# Patient Record
Sex: Female | Born: 1975 | Race: White | Hispanic: No | Marital: Married | Smoking: Never smoker
Health system: Southern US, Community
[De-identification: ages and names within clinical notes are randomized; demographics above are authoritative.]

## PROBLEM LIST (undated history)

## (undated) DIAGNOSIS — F329 Major depressive disorder, single episode, unspecified: Secondary | ICD-10-CM

## (undated) DIAGNOSIS — I10 Essential (primary) hypertension: Secondary | ICD-10-CM

## (undated) DIAGNOSIS — F419 Anxiety disorder, unspecified: Secondary | ICD-10-CM

## (undated) DIAGNOSIS — F32A Depression, unspecified: Secondary | ICD-10-CM

## (undated) HISTORY — PX: WISDOM TOOTH EXTRACTION: SHX21

## (undated) HISTORY — PX: DIAGNOSTIC LAPAROSCOPY: SUR761

## (undated) SURGERY — Surgical Case
Anesthesia: *Unknown

---

## 2001-12-08 ENCOUNTER — Inpatient Hospital Stay (HOSPITAL_COMMUNITY): Admission: AD | Admit: 2001-12-08 | Discharge: 2001-12-11 | Payer: Self-pay | Admitting: Obstetrics and Gynecology

## 2001-12-08 ENCOUNTER — Encounter (INDEPENDENT_AMBULATORY_CARE_PROVIDER_SITE_OTHER): Payer: Self-pay | Admitting: *Deleted

## 2003-03-30 ENCOUNTER — Other Ambulatory Visit: Admission: RE | Admit: 2003-03-30 | Discharge: 2003-03-30 | Payer: Self-pay | Admitting: Obstetrics and Gynecology

## 2004-06-05 ENCOUNTER — Other Ambulatory Visit: Admission: RE | Admit: 2004-06-05 | Discharge: 2004-06-05 | Payer: Self-pay | Admitting: Obstetrics and Gynecology

## 2005-06-09 ENCOUNTER — Other Ambulatory Visit: Admission: RE | Admit: 2005-06-09 | Discharge: 2005-06-09 | Payer: Self-pay | Admitting: Obstetrics and Gynecology

## 2005-06-24 ENCOUNTER — Ambulatory Visit (HOSPITAL_COMMUNITY): Admission: RE | Admit: 2005-06-24 | Discharge: 2005-06-24 | Payer: Self-pay | Admitting: Obstetrics and Gynecology

## 2005-06-24 ENCOUNTER — Encounter (INDEPENDENT_AMBULATORY_CARE_PROVIDER_SITE_OTHER): Payer: Self-pay | Admitting: *Deleted

## 2006-12-17 ENCOUNTER — Ambulatory Visit (HOSPITAL_COMMUNITY): Admission: RE | Admit: 2006-12-17 | Discharge: 2006-12-17 | Payer: Self-pay | Admitting: Obstetrics and Gynecology

## 2007-07-22 ENCOUNTER — Inpatient Hospital Stay (HOSPITAL_COMMUNITY): Admission: AD | Admit: 2007-07-22 | Discharge: 2007-07-25 | Payer: Self-pay | Admitting: Obstetrics & Gynecology

## 2007-07-23 ENCOUNTER — Encounter (INDEPENDENT_AMBULATORY_CARE_PROVIDER_SITE_OTHER): Payer: Self-pay | Admitting: Obstetrics and Gynecology

## 2007-07-30 ENCOUNTER — Ambulatory Visit: Payer: Self-pay | Admitting: Pulmonary Disease

## 2007-07-30 ENCOUNTER — Encounter: Payer: Self-pay | Admitting: Emergency Medicine

## 2007-07-30 ENCOUNTER — Inpatient Hospital Stay (HOSPITAL_COMMUNITY): Admission: AD | Admit: 2007-07-30 | Discharge: 2007-08-04 | Payer: Self-pay | Admitting: Obstetrics & Gynecology

## 2009-07-27 ENCOUNTER — Ambulatory Visit (HOSPITAL_COMMUNITY): Admission: RE | Admit: 2009-07-27 | Discharge: 2009-07-27 | Payer: Self-pay | Admitting: Obstetrics and Gynecology

## 2009-09-10 ENCOUNTER — Ambulatory Visit (HOSPITAL_COMMUNITY): Admission: RE | Admit: 2009-09-10 | Discharge: 2009-09-10 | Payer: Self-pay | Admitting: Obstetrics and Gynecology

## 2009-10-12 ENCOUNTER — Ambulatory Visit (HOSPITAL_COMMUNITY): Admission: RE | Admit: 2009-10-12 | Discharge: 2009-10-12 | Payer: Self-pay | Admitting: Obstetrics and Gynecology

## 2009-11-26 ENCOUNTER — Inpatient Hospital Stay (HOSPITAL_COMMUNITY): Admission: AD | Admit: 2009-11-26 | Discharge: 2009-11-26 | Payer: Self-pay | Admitting: Obstetrics & Gynecology

## 2009-11-29 ENCOUNTER — Other Ambulatory Visit: Payer: Self-pay | Admitting: Obstetrics and Gynecology

## 2009-11-29 ENCOUNTER — Inpatient Hospital Stay (HOSPITAL_COMMUNITY): Admission: AD | Admit: 2009-11-29 | Discharge: 2009-12-02 | Payer: Self-pay | Admitting: Obstetrics and Gynecology

## 2010-10-20 ENCOUNTER — Encounter: Payer: Self-pay | Admitting: Obstetrics and Gynecology

## 2010-12-22 LAB — CBC
HCT: 32.8 % — ABNORMAL LOW (ref 36.0–46.0)
HCT: 33.8 % — ABNORMAL LOW (ref 36.0–46.0)
HCT: 34.4 % — ABNORMAL LOW (ref 36.0–46.0)
Hemoglobin: 11.2 g/dL — ABNORMAL LOW (ref 12.0–15.0)
Hemoglobin: 11.7 g/dL — ABNORMAL LOW (ref 12.0–15.0)
Hemoglobin: 9.9 g/dL — ABNORMAL LOW (ref 12.0–15.0)
MCHC: 34.1 g/dL (ref 30.0–36.0)
MCHC: 34.2 g/dL (ref 30.0–36.0)
MCV: 89.6 fL (ref 78.0–100.0)
Platelets: 125 10*3/uL — ABNORMAL LOW (ref 150–400)
Platelets: 126 10*3/uL — ABNORMAL LOW (ref 150–400)
RBC: 3.66 MIL/uL — ABNORMAL LOW (ref 3.87–5.11)
RBC: 3.77 MIL/uL — ABNORMAL LOW (ref 3.87–5.11)
RDW: 11.8 % (ref 11.5–15.5)
WBC: 12 10*3/uL — ABNORMAL HIGH (ref 4.0–10.5)

## 2010-12-22 LAB — COMPREHENSIVE METABOLIC PANEL
ALT: 16 U/L (ref 0–35)
Albumin: 2.7 g/dL — ABNORMAL LOW (ref 3.5–5.2)
BUN: 5 mg/dL — ABNORMAL LOW (ref 6–23)
Chloride: 105 mEq/L (ref 96–112)
Creatinine, Ser: 0.59 mg/dL (ref 0.4–1.2)
GFR calc non Af Amer: >60 mL/min (ref >60)
Potassium: 3.2 mEq/L — ABNORMAL LOW (ref 3.5–5.1)
Total Bilirubin: 0.2 mg/dL — ABNORMAL LOW (ref 0.3–1.2)

## 2010-12-22 LAB — URINALYSIS, ROUTINE W REFLEX MICROSCOPIC
Bilirubin Urine: NEGATIVE
Nitrite: NEGATIVE
Protein, ur: NEGATIVE mg/dL
Specific Gravity, Urine: 1.01 (ref 1.005–1.030)

## 2011-02-14 NOTE — Op Note (Signed)
NAME:  Ellen Mendoza, Ellen Mendoza                 ACCOUNT NO.:  [id number]   MEDICAL RECORD NO.:  [id number]          PATIENT TYPE:  AMB   LOCATION:  SDC                           FACILITY:  [facility]   PHYSICIAN:  Randye Lobo, M.D.   DATE OF BIRTH:  12/30/1975   DATE OF PROCEDURE:  06/24/2005  DATE OF DISCHARGE:                                 OPERATIVE REPORT   PREOPERATIVE DIAGNOSIS:  1.  Chronic pelvic pain.  2.  Dysmenorrhea.  3.  Dyspareunia.   POSTOPERATIVE DIAGNOSIS:  1.  Chronic pelvic pain.  2.  Dysmenorrhea.  3.  Dyspareunia.  4.  Uterine left cornual perforation.   PROCEDURE:  Diagnostic laparoscopy, excision of left paratubal cyst, cautery  to uterine fundus.   SURGEON:  Randye Lobo, M.D.   ASSISTANT:  Carrington Clamp, M.D.   ANESTHESIA:  General endotracheal, local with Marcaine.   IV FLUIDS:  1000 mL  Ringer's lactate.   ESTIMATED BLOOD LOSS:  Minimal.   URINE OUTPUT:  50 mL   COMPLICATIONS:  Is uterine fundal perforation to the left cornual region  with a uterine sound.   INDICATIONS FOR SURGERY:  The patient is a 35 year old gravida 1, para 1  Caucasian female with a last menstrual period May 30, 2005 who  presented to the office with chronic left lower quadrant pain, dysmenorrhea,  dyspareunia. The patient reported that oral contraceptives did not  ameliorate her symptoms. She had an ultrasound performed May 29, 2005  which documented a normal uterus and ovaries. The patient previously had  negative gonorrhea and Chlamydia cultures. The patient wished for surgical  evaluation and treatment of her pelvic pain.   FINDINGS:  Exam under anesthesia revealed a small, anteverted, mobile  uterus. No adnexal masses were appreciated.   Laparoscopy demonstrated perforation of the uterine sound to the uterine  fundus in the region of a left cornua of the patient. This was noted to  bleed slightly after the uterine sound was withdrawn back further into the  uterine cavity. The uterus itself was otherwise unremarkable. There was a 1  cm left paratubal cyst. There was a 2 cm cyst of the right ovary which  appeared to be simple. The fallopian tubes were unremarkable. The patient  had no evidence of adhesions nor endometriosis in the abdomen or pelvis. The  appendix was normal as was the liver.   SPECIMENS:  Left paratubal cyst was sent to pathology.   PROCEDURE:  The patient was reidentified in the preoperative hold area. She  was brought down to the operating room where general endotracheal anesthesia  was induced. The patient was then placed in the dorsal lithotomy position  and the abdomen and vagina were sterilely prepped and draped. A Foley  catheter was placed inside the bladder. The speculum was placed inside the  vagina and a single-tooth tenaculum was a placed on the anterior cervical  lip. An attempt was made to pass the Hulka tenaculum, however the cervix  appeared to have a bit of stenosis. The vaginal instruments were removed and  bimanual examination was performed  which documented an anteverted uterus.  The speculum was then replaced inside the vagina and the single-tooth  tenaculum was replaced on the anterior cervical lip. A uterine sound was  placed through the cervical os without difficulty. This was withdrawn and  again an attempt was made to place a Hulka tenaculum which would not pass  easily through the cervix. The decision was therefore made to place the  uterine sound back inside the uterine cavity and attach this to the single-  tooth tenaculum using Steri-Strips for uterine manipulation as I had done  for many other patients with similar circumstances.   Attention was then turned to the abdomen where the laparoscopy was  performed. An Allis clamp was placed below the umbilicus elevating it and a  1 cm umbilical incision was then created with the scalpel. An Allis clamp  was used to dissect down to the level of the  fascia. A 10 mm trocar was then  inserted directly into the peritoneal cavity. Laparoscope confirmed proper  placement of the instruments and 11 scope confirmed proper placement within  the trocar and a pneumoperitoneum was then achieved with CO2 gas. The  patient was then placed in the Trendelenburg position and the laparoscope  was used to perform an initial inspection of the abdominal and pelvic  organs.   A 5 mm suprapubic incision was then created with the scalpel and a 5 mm  trocar was placed under direct visualization of the laparoscope. Further  inspection of the abdominal and pelvic organs was performed and the findings  were as noted above. The perforation was noted. An additional 5 mm incision  was created, this time in the right lower quadrant. A 5 mm trocar was placed  under direct visualization of the laparoscope. The left paratubal cyst was  then grasped at this point and bipolar cautery was used to cauterize the  stalk of the left paratubal cyst. It was then sharply excised and the cyst  was removed and sent to pathology. The uterine sound was withdrawn from the  uterine fundal position at this point and there was some slight oozing  appreciated of the uterine serosa. The left fallopian tube was retracted out  of the way so that the bipolar cautery forceps could be used to  superficially cauterize the bleeding area which was noted to incorporate  approximately 4-5 mm of length of the uterine fundal serosa. This was  successful. The area was then irrigated and there was no further bleeding  appreciated. The pneumoperitoneum was significantly released and again there  was no additional bleeding. The procedure was therefore terminated.   The laparoscopic instruments were removed under visualization of the  laparoscope. The umbilical trocar was removed. The skin incisions were closed with subcuticular sutures of 3-0 plain. Steri-Strips and Band-Aids  were placed over the  incisions.   An inspection of the cervix was performed by placing a speculum in the  vagina and there was no evidence of a laceration of the cervical lip.  Hemostasis was good.   The patient's Foley catheter was removed. She was cleansed of any remaining  Betadine, awakened, and escorted to the recovery room in stable and awake  condition.      Randye Lobo, M.D.  Electronically Signed     BES/MEDQ  D:  06/24/2005  T:  06/24/2005  Job:  [id number]

## 2011-02-14 NOTE — Discharge Summary (Signed)
NAMEFRANCESS, Ellen Mendoza                 ACCOUNT NO.:  [id number]   MEDICAL RECORD NO.:  [id number]          PATIENT TYPE:  INP   LOCATION:  9304                          FACILITY:  [facility]   PHYSICIAN:  Malva Limes, M.D.    DATE OF BIRTH:  [birth year]   DATE OF ADMISSION:  07/20/2007  DATE OF DISCHARGE:  08/04/2007                               DISCHARGE SUMMARY   FINAL DIAGNOSIS:  1. Postpartum patient with delivery on October 24.  2. Postpartum headaches.  3. Hypertension.  4. Postpartum preeclampsia.   COMPLICATIONS:  None.   This 35 year old, G2, P2-0-0-2 had just previously had a delivery on  July 23, 2007.  She presents at this time with a headache, shortness  of breath. The presents at the hospital.  She is seen by pulmonology.  Blood pressure on presentation was normotensive. She was taken to the  [facility] ED and treated with mag sulfate 2 mg x2. Now the patient's  blood pressure is about 113/109 and pulmonary was asked to evaluate the  patient. She has continued on her mag sulfate. A  2-D echo was ordered  to rule out postpartum cardiac cardiomegaly, a drug screen to rule out  any type of amphetamines. The patient did have a negative chest x-ray at  this time. The patient must have started out at [facility]. The patient  is now at [facility] in the ICU. She is still having headaches.  She is normotensive. The patient continues to have a headache. Percocet  was given to the patient, it did help her headache. Mag sulfate was  stopped on hospital day #4. The patient was started on oral labetalol  200 mg b.i.d. Her lab work has improved. She was able to be transferred  to the regular mother baby unit. By hospital day #6, the patient was  felt ready for discharge, she was stable.  She was rarely having any  headaches.  She was to continue her labetalol 200 mg twice daily and her  Percocet was given for pain 1-2 every 4-6 hours as needed for pain.  She  has a follow-up  appointment on Monday the 10th. The patient's to call  with any elevated blood pressures or if her headache intensifies or any  visual changes.   LABS ON DISCHARGE:  The patient has a hemoglobin of 13.1, white blood  cell count of 10.1, platelets of 292,000.  She she an AST of 18 with an  ALT of 49. Her LDH and uric acid are both within normal limits upon  discharge.      Leilani Able, P.A.-C.    ______________________________  Malva Limes, M.D.    MB/MEDQ  D:  08/25/2007  T:  08/25/2007  Job:  [id number]

## 2011-07-08 LAB — CBC
HCT: 38.5
Hemoglobin: 13.1
Hemoglobin: 13.7
Hemoglobin: 13.7
MCHC: 34.6
MCV: 92.3
MCV: 92.6
Platelets: 283
Platelets: 305
RBC: 4.15
RBC: 4.27
RBC: 4.3
RDW: 12
RDW: 12.2
WBC: 10
WBC: 10.1

## 2011-07-08 LAB — CARDIAC PANEL(CRET KIN+CKTOT+MB+TROPI)
CK, MB: 2.2
Total CK: 128

## 2011-07-08 LAB — COMPREHENSIVE METABOLIC PANEL
ALT: 49 — ABNORMAL HIGH
AST: 55 — ABNORMAL HIGH
Albumin: 2.6 — ABNORMAL LOW
Alkaline Phosphatase: 102
Alkaline Phosphatase: 89
BUN: 10
CO2: 28
Calcium: 7.3 — ABNORMAL LOW
Chloride: 104
Creatinine, Ser: 0.75
GFR calc Af Amer: >60
GFR calc non Af Amer: >60
GFR calc non Af Amer: >60
GFR calc non Af Amer: >60
Glucose, Bld: 110 — ABNORMAL HIGH
Glucose, Bld: 96
Potassium: 3.1 — ABNORMAL LOW
Potassium: 3.6
Sodium: 138
Sodium: 143
Total Bilirubin: 0.5
Total Bilirubin: 0.6
Total Protein: 5.3 — ABNORMAL LOW
Total Protein: 5.6 — ABNORMAL LOW
Total Protein: 6.1

## 2011-07-08 LAB — DIFFERENTIAL
Basophils Relative: 0
Lymphocytes Relative: 22
Lymphs Abs: 2.2
Monocytes Relative: 7
Neutrophils Relative %: 70

## 2011-07-08 LAB — LACTATE DEHYDROGENASE
LDH: 245
LDH: 276 — ABNORMAL HIGH

## 2011-07-08 LAB — MAGNESIUM: Magnesium: 4.9 — ABNORMAL HIGH

## 2011-07-09 LAB — URINALYSIS, ROUTINE W REFLEX MICROSCOPIC
Bilirubin Urine: NEGATIVE
Nitrite: NEGATIVE
Specific Gravity, Urine: 1.015
Specific Gravity, Urine: 1.011
Urobilinogen, UA: 0.2
Urobilinogen, UA: 0.2
pH: 7
pH: 7

## 2011-07-09 LAB — I-STAT 8, (EC8 V) (CONVERTED LAB)
BUN: 17
Bicarbonate: 23.6
Glucose, Bld: 84
Hemoglobin: 12.6
Sodium: 138
pCO2, Ven: 31.7 — ABNORMAL LOW
pH, Ven: 7.48 — ABNORMAL HIGH

## 2011-07-09 LAB — DIFFERENTIAL
Lymphocytes Relative: 27
Lymphs Abs: 2.8
Monocytes Absolute: 0.6
Monocytes Relative: 5
Neutro Abs: 6.8
Neutrophils Relative %: 65

## 2011-07-09 LAB — URINE MICROSCOPIC-ADD ON

## 2011-07-09 LAB — CBC
HCT: 31.4 — ABNORMAL LOW
HCT: 35.4 — ABNORMAL LOW
Hemoglobin: 12.4
MCHC: 35.6
MCHC: 35.1
MCV: 89.9
Platelets: 143 — ABNORMAL LOW
Platelets: 157
Platelets: 249
RDW: 12.2
RDW: 12.3
RDW: 11.9
WBC: 13.3 — ABNORMAL HIGH

## 2011-07-09 LAB — COMPREHENSIVE METABOLIC PANEL
Albumin: 3 — ABNORMAL LOW
BUN: 16
Calcium: 8.5
Glucose, Bld: 83
Sodium: 138
Total Protein: 5.9 — ABNORMAL LOW

## 2011-07-09 LAB — RPR: RPR Ser Ql: NONREACTIVE

## 2011-07-09 LAB — POCT CARDIAC MARKERS
CKMB, poc: 4.2
Myoglobin, poc: 74.3
Myoglobin, poc: 79.9
Operator id: 270111
Troponin i, poc: <0.05

## 2011-07-09 LAB — B-NATRIURETIC PEPTIDE (CONVERTED LAB): Pro B Natriuretic peptide (BNP): 505 — ABNORMAL HIGH

## 2011-07-29 IMAGING — US US OB DETAIL+14 WK
1 series · 14 of 28 positions shown · non-contrast
Comparison: none

OBSTETRICAL ULTRASOUND:
 This ultrasound was performed in The [HOSPITAL], and the [facility] report will be stored to [REDACTED] PACS.  This report is also available in [HOSPITAL]?s accessANYware.

[Series 1: us ob detail+14 wk · 108 acquisitions, 14 frames shown]
[im 4/108]
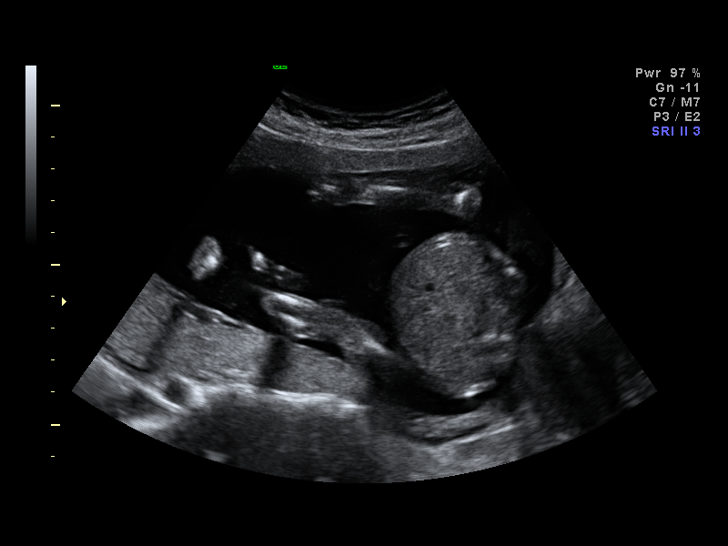
[im 12/108]
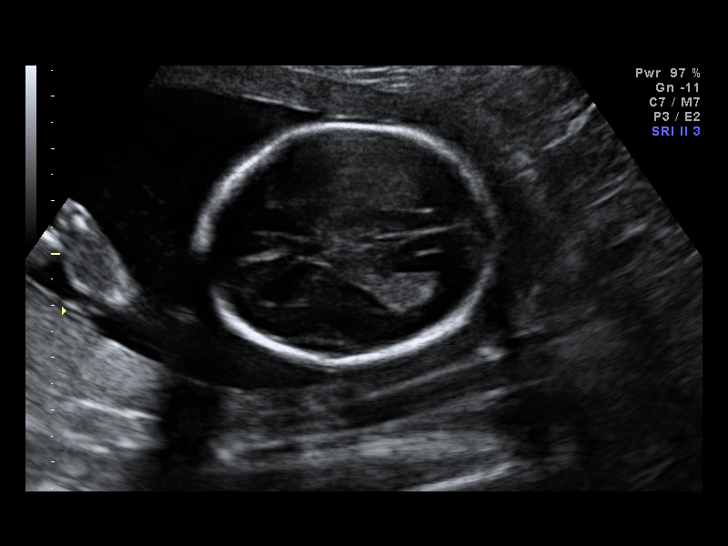
[im 20/108]
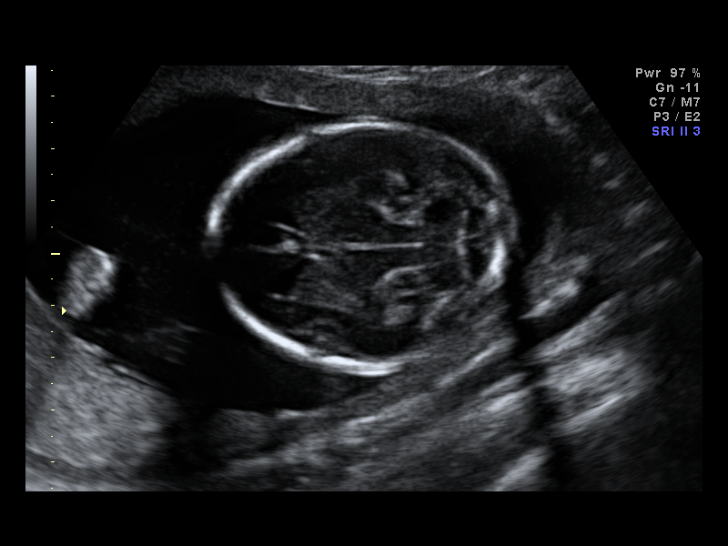
[im 28/108]
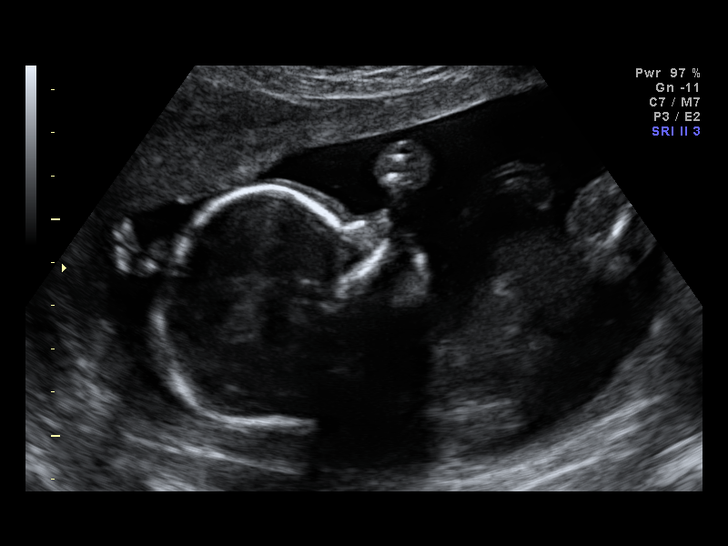
[im 36/108]
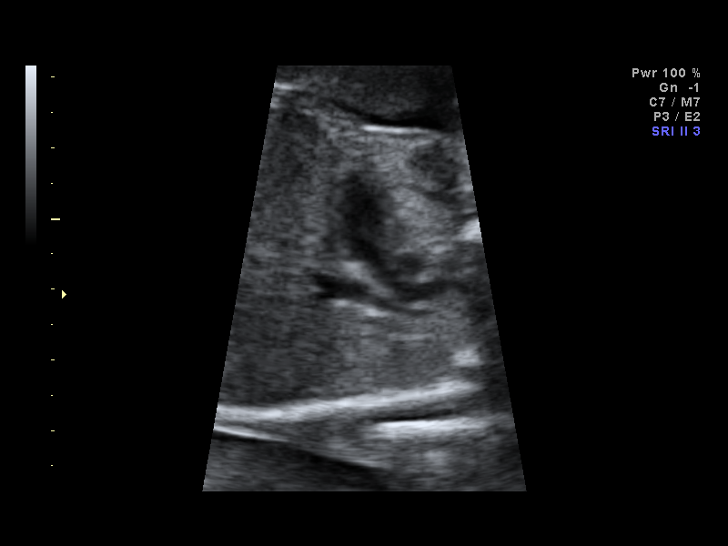
[im 44/108]
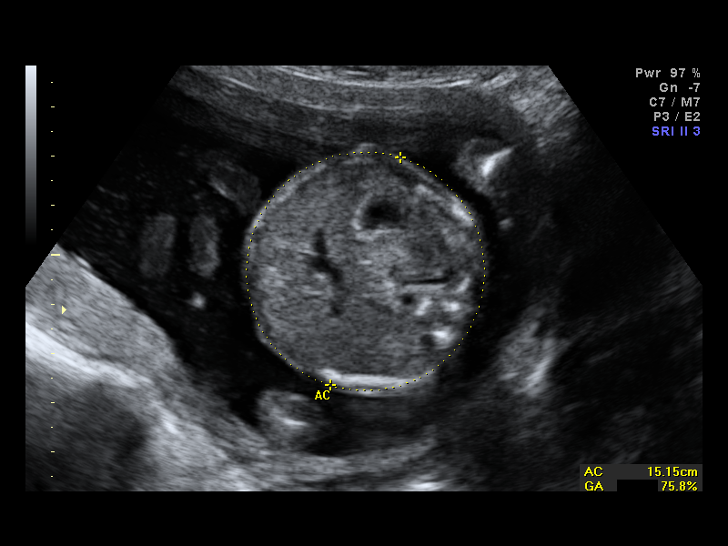
[im 52/108]
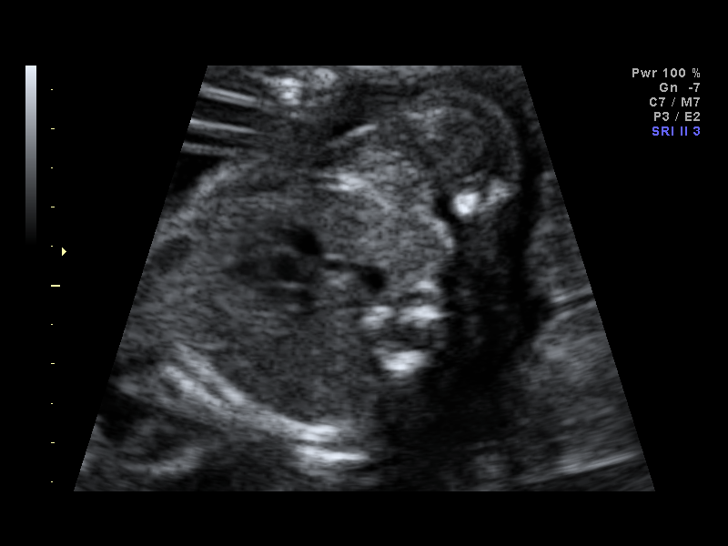
[im 60/108]
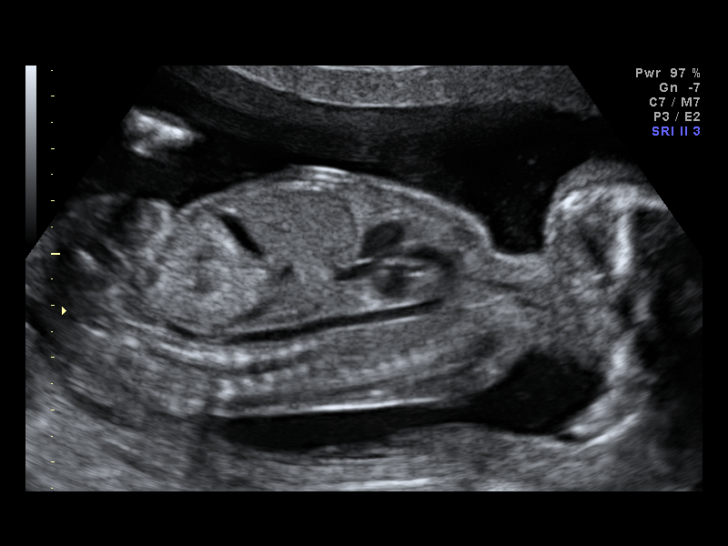
[im 68/108]
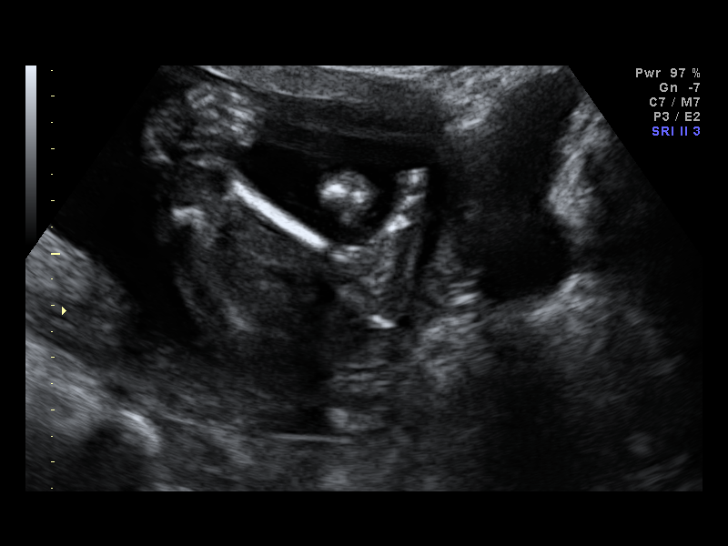
[im 76/108]
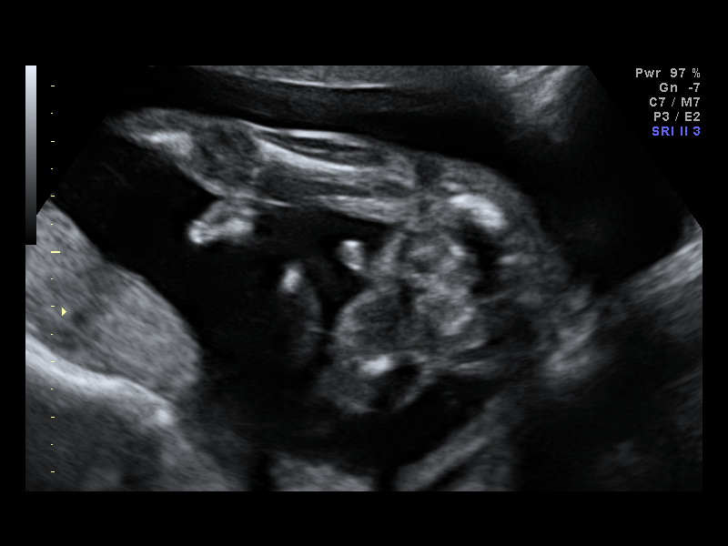
[im 84/108]
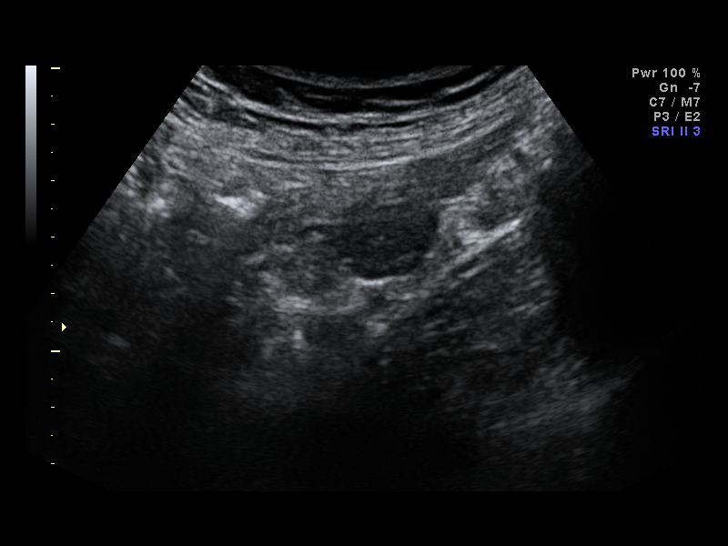
[im 92/108]
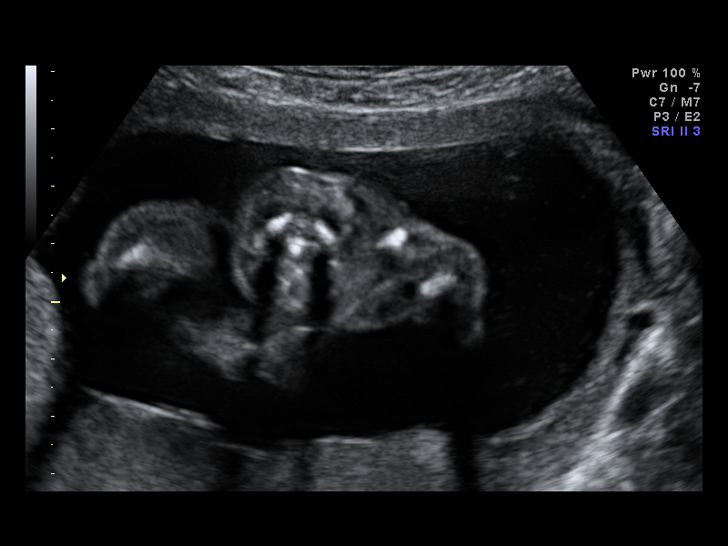
[im 100/108]
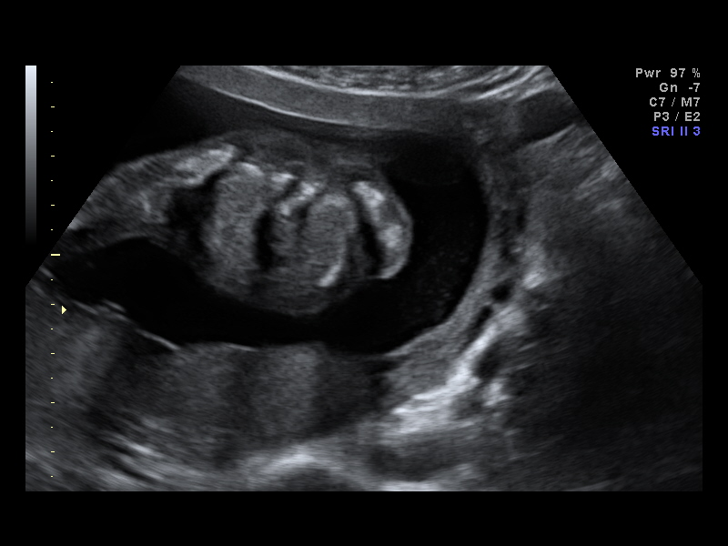
[im 108/108]
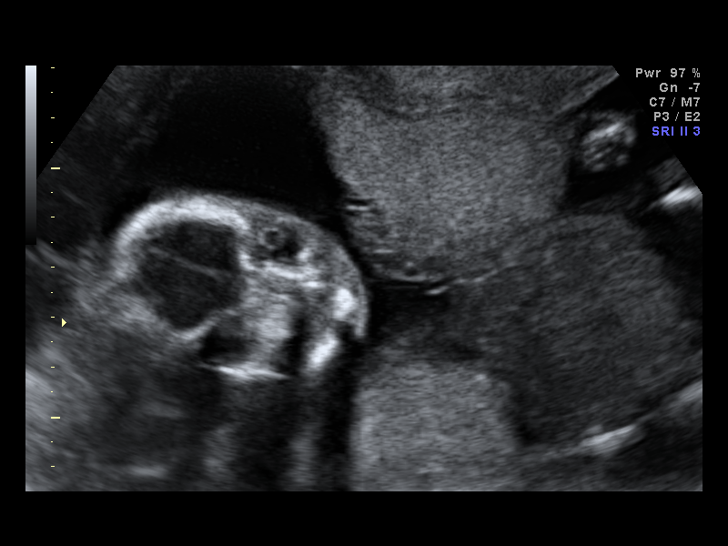

[14 of 28 positions shown; findings below may reference images not displayed]

IMPRESSION: [facility] has also been faxed to the ordering physician.

## 2011-09-12 IMAGING — US US OB FOLLOW-UP
1 series · 18 of 28 positions shown · non-contrast
Comparison: none

OBSTETRICAL ULTRASOUND:
 This ultrasound was performed in The [HOSPITAL], and the [facility]/GY[facility] report will be stored to [REDACTED] PACS.  This report is also available in [HOSPITAL]?s accessANYware.

[Series 1: us ob follow-up · 53 acquisitions, 18 frames shown]
[im 1/53]
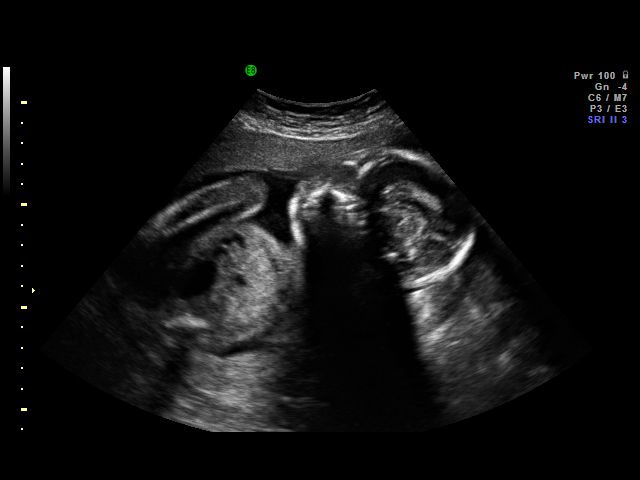
[im 4/53]
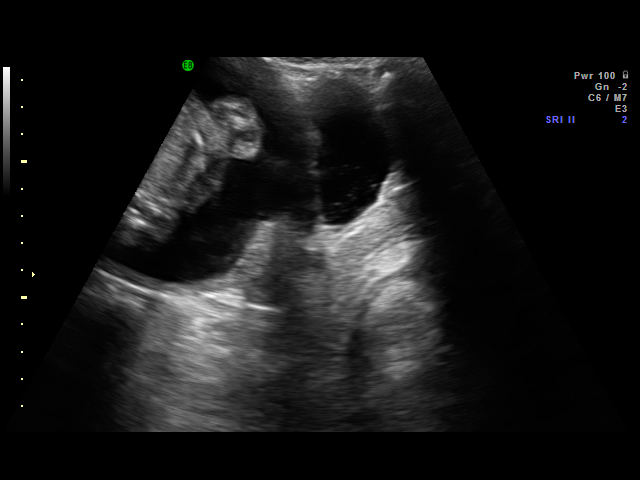
[im 6/53]
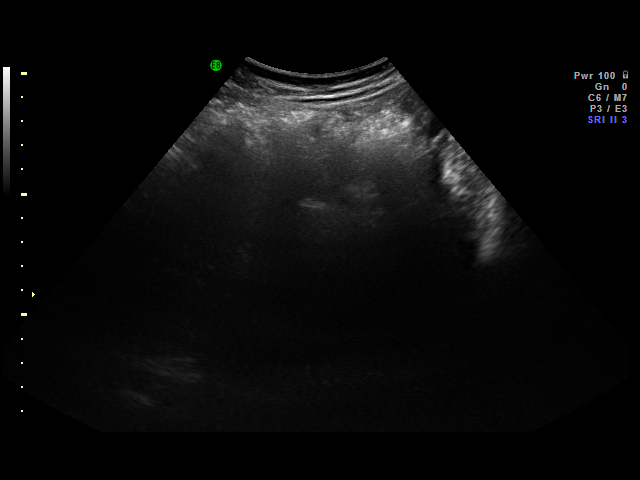
[im 10/53]
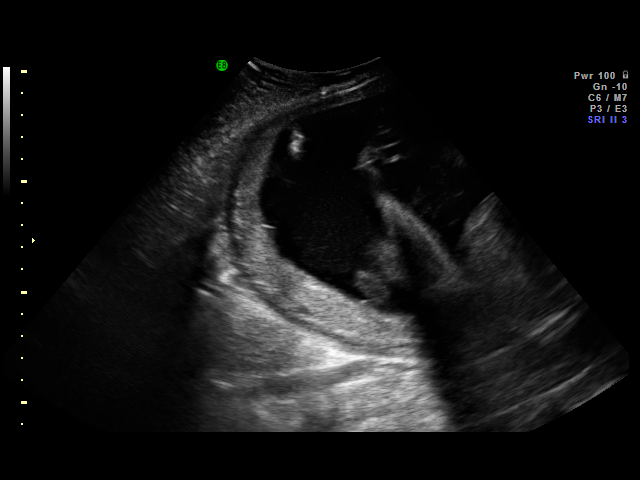
[im 14/53]
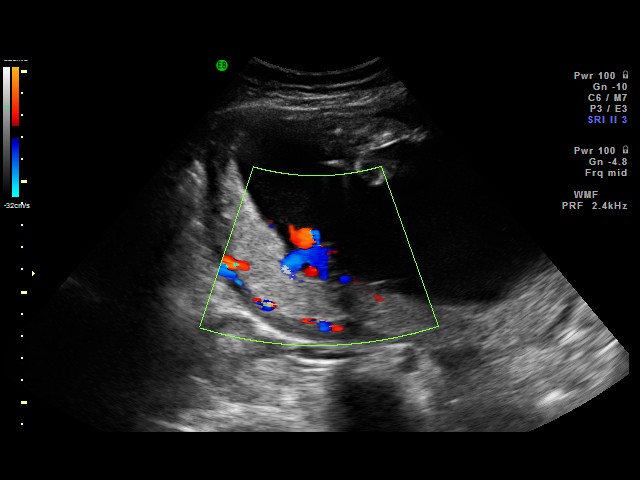
[im 16/53]
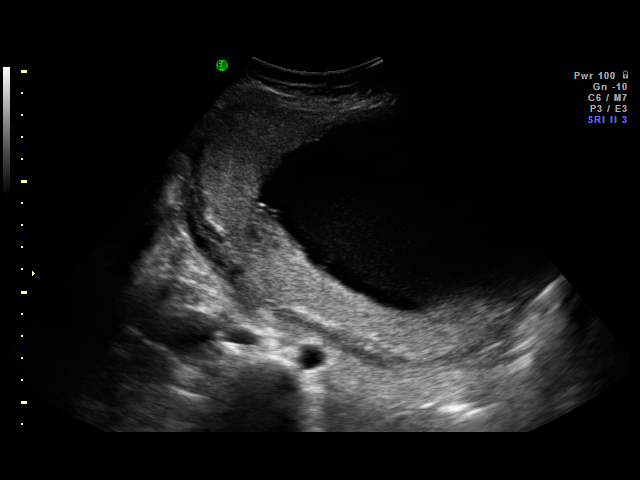
[im 20/53]
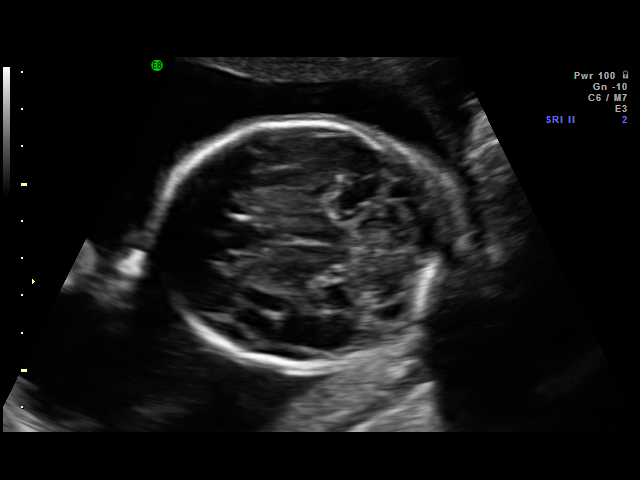
[im 22/53]
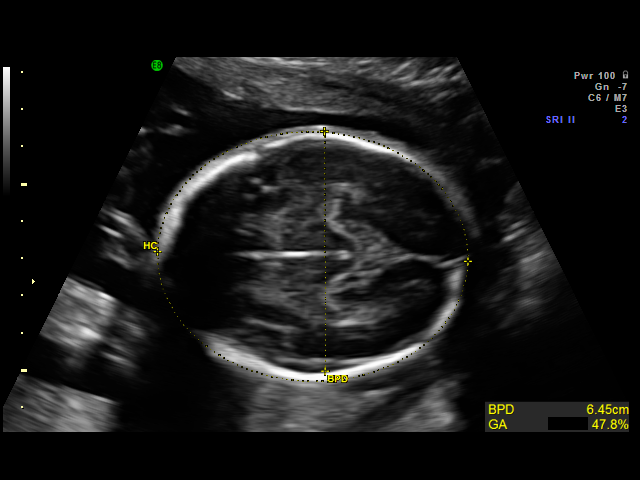
[im 26/53]
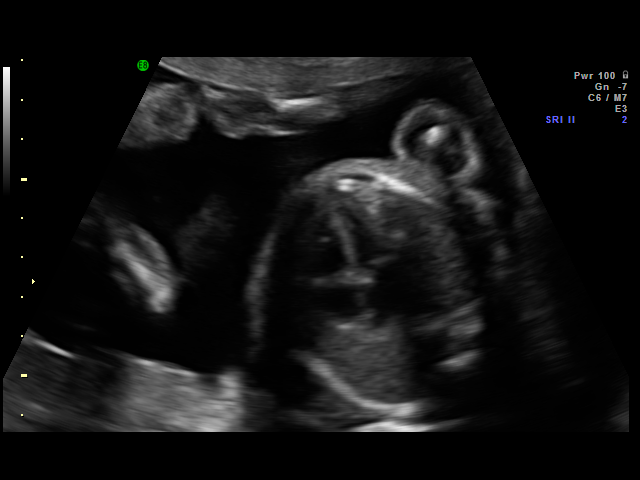
[im 27/53]
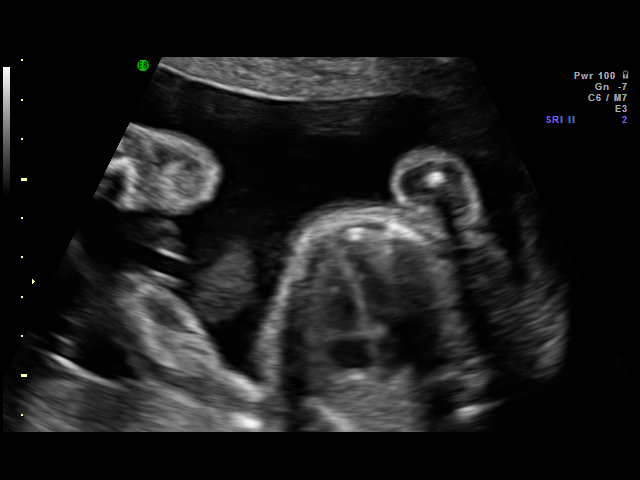
[im 31/53]
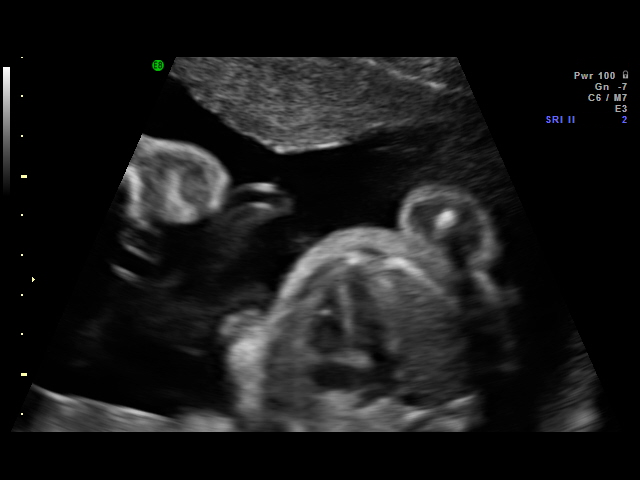
[im 33/53]
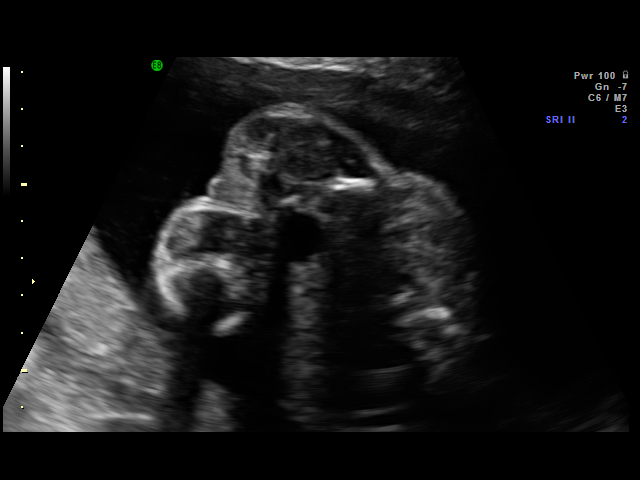
[im 37/53]
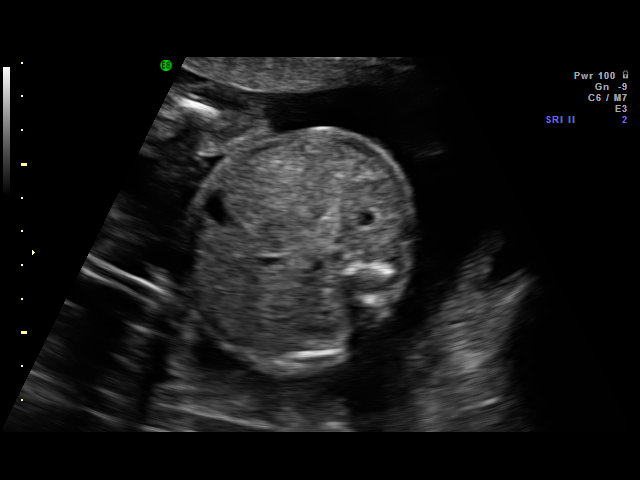
[im 41/53]
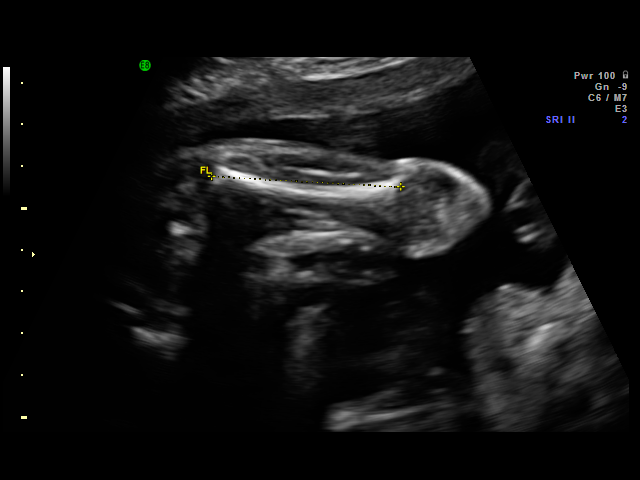
[im 43/53]
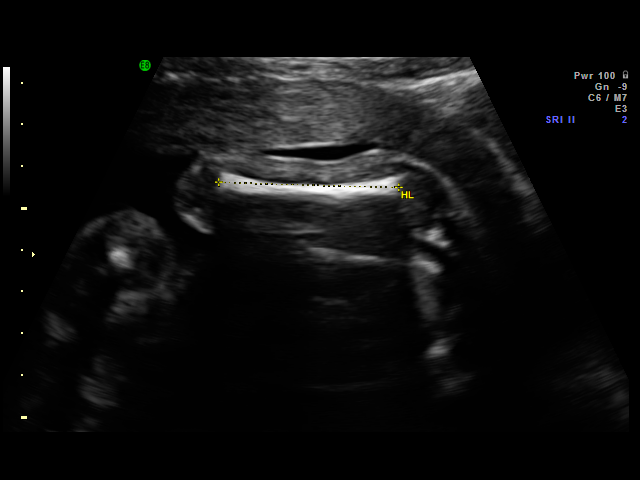
[im 47/53]
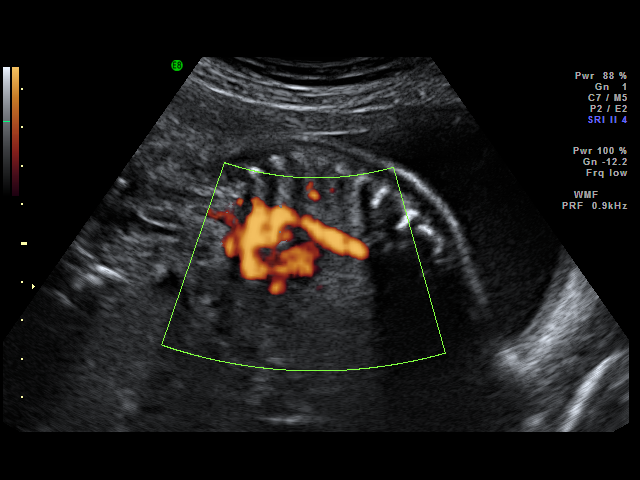
[im 49/53]
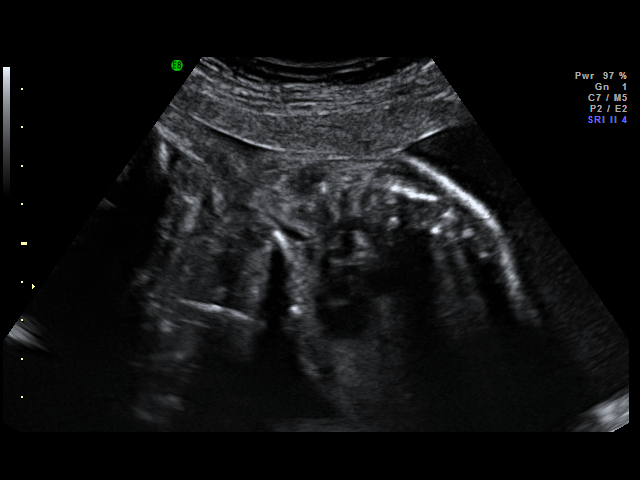
[im 53/53]
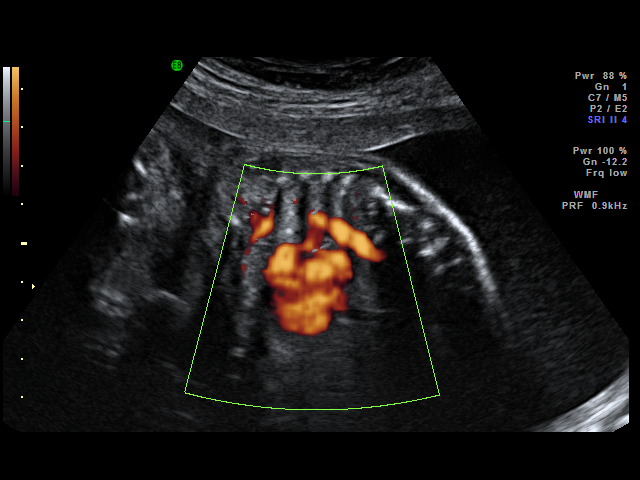

[18 of 28 positions shown; findings below may reference images not displayed]

IMPRESSION: [facility] has also been faxed to the ordering physician.

## 2011-10-14 IMAGING — US US OB FOLLOW-UP
1 series · 14 of 28 positions shown · non-contrast
Comparison: none

OBSTETRICAL ULTRASOUND:
 This ultrasound was performed in The [HOSPITAL], and the [facility]/GY[facility] report will be stored to [REDACTED] PACS.  This report is also available in [HOSPITAL]?s accessANYware.

[Series 1: us ob follow-up · 14 of 43 slices shown]
[im 2/43]
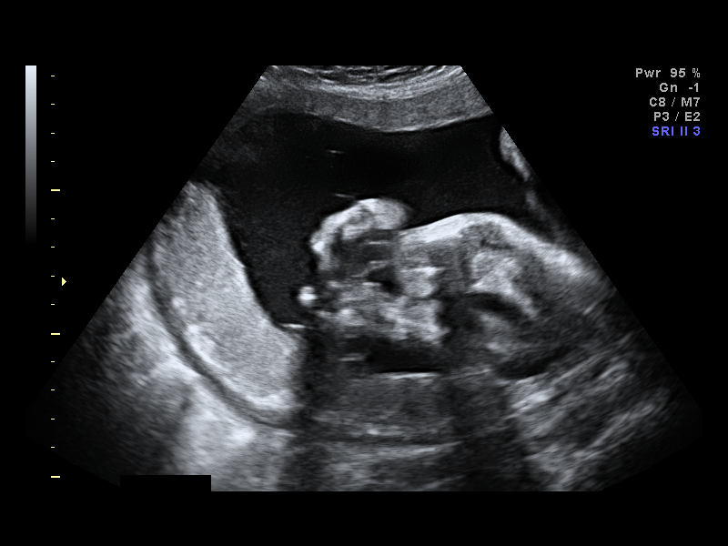
[im 5/43]
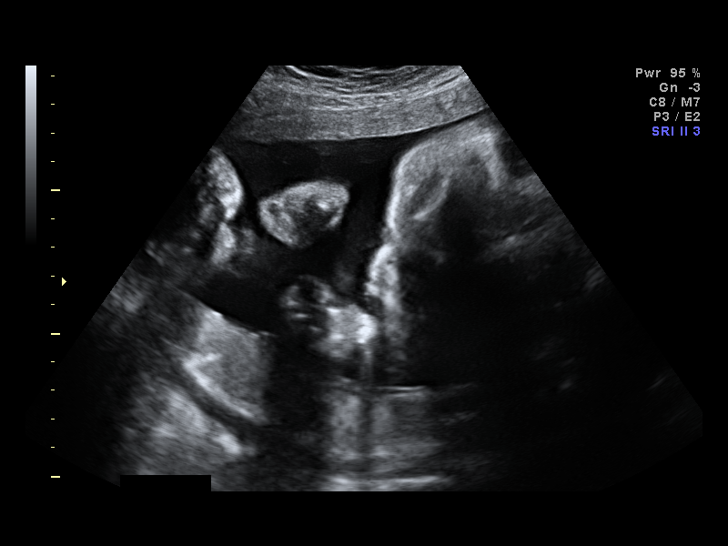
[im 8/43]
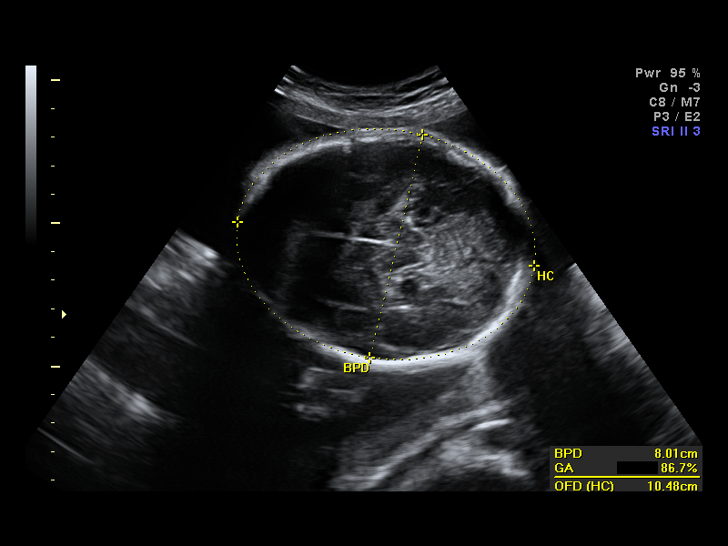
[im 11/43]
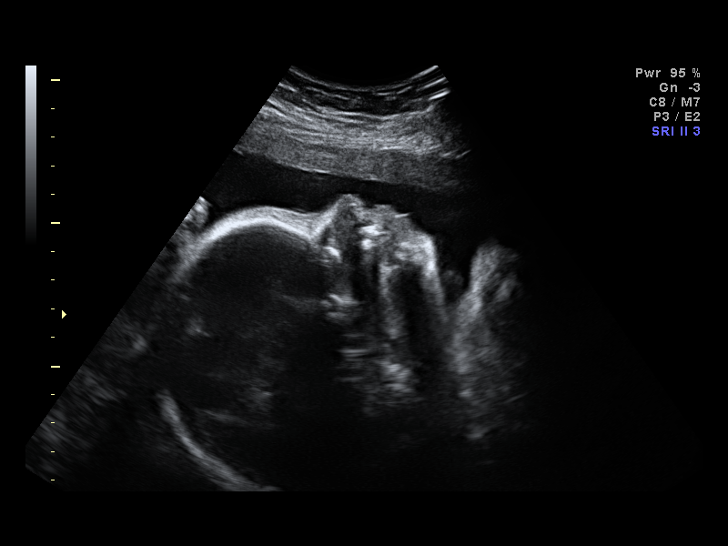
[im 15/43]
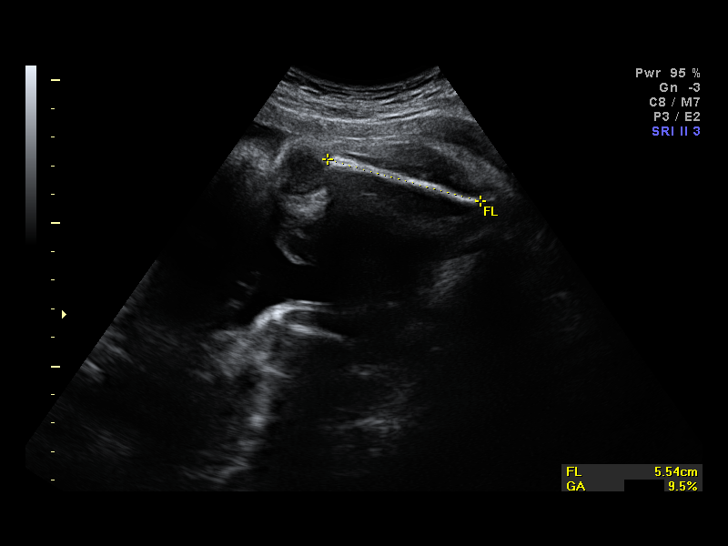
[im 18/43]
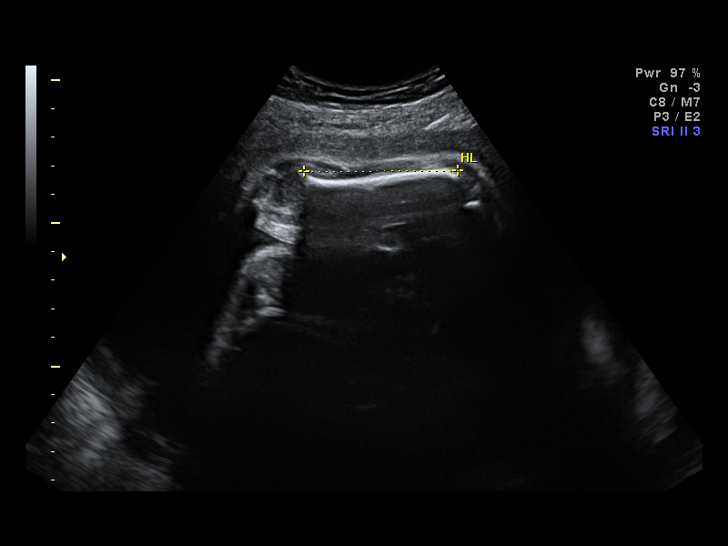
[im 21/43]
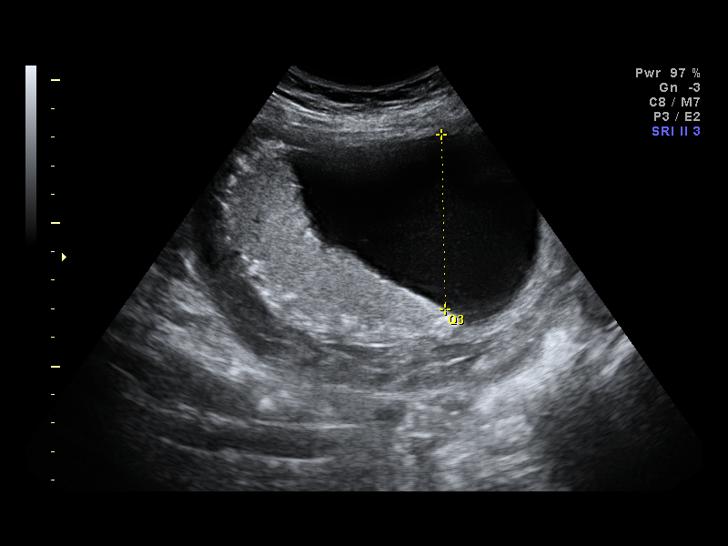
[im 24/43]
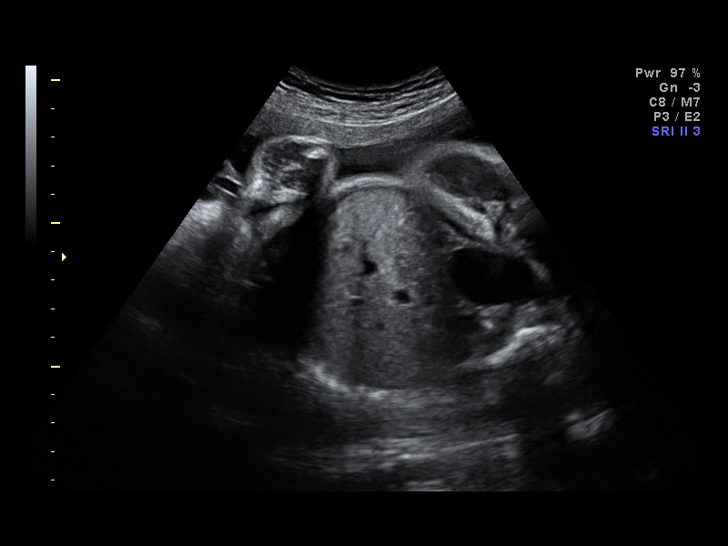
[im 27/43]
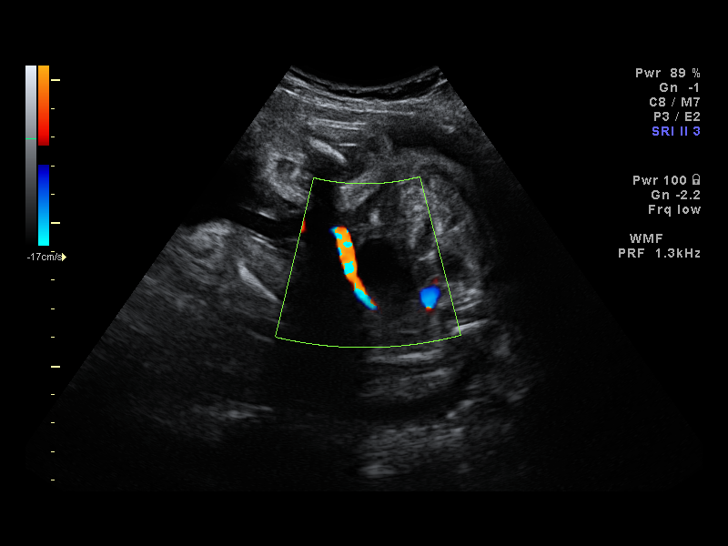
[im 30/43]
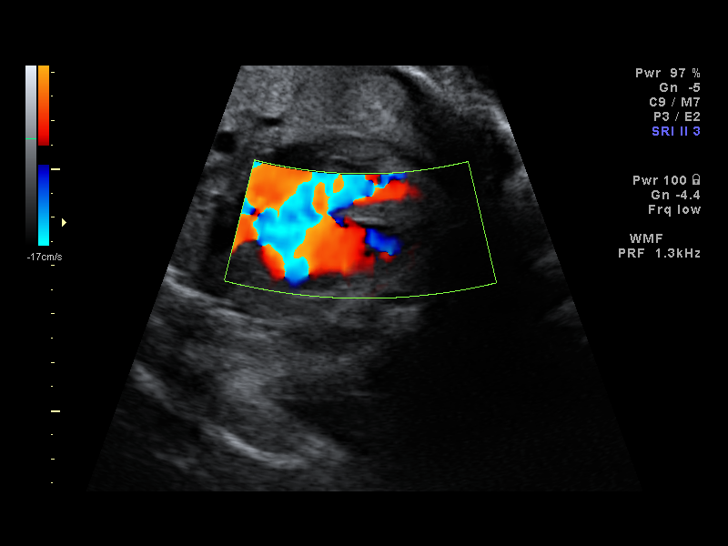
[im 33/43]
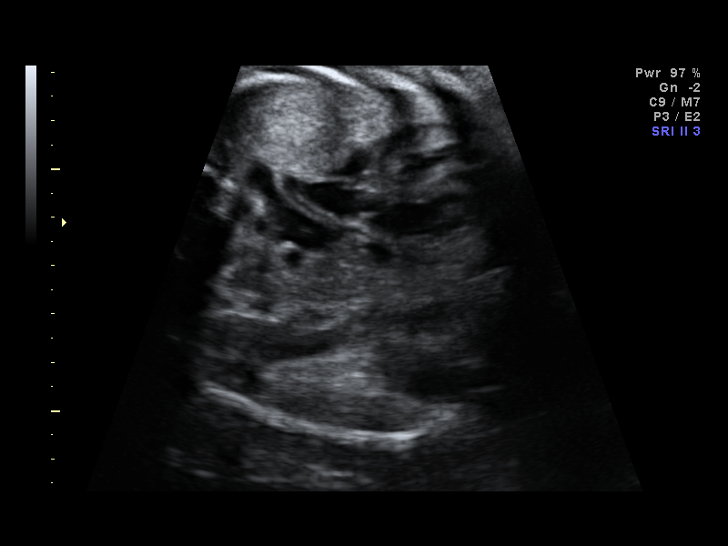
[im 36/43]
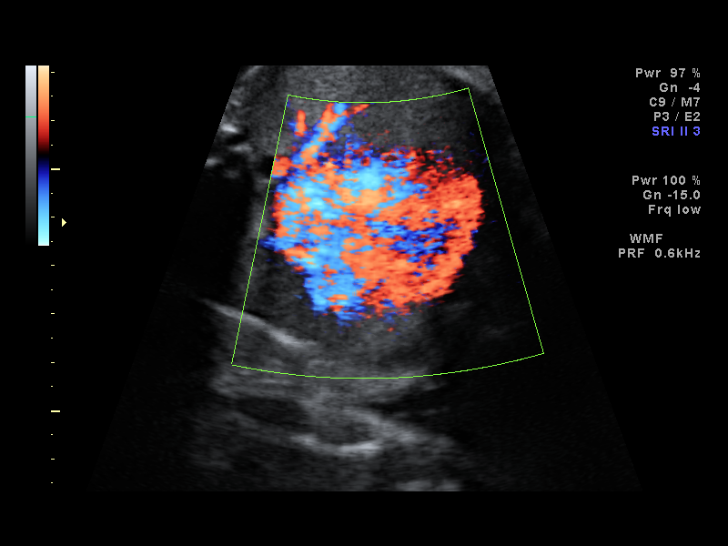
[im 39/43]
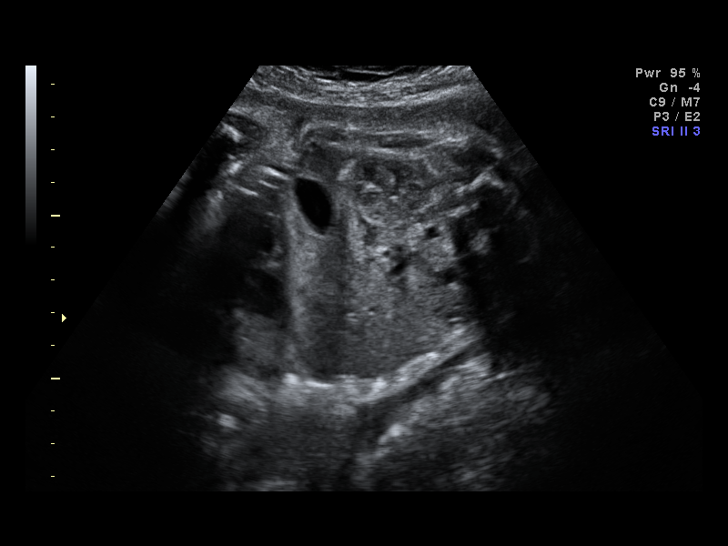
[im 43/43]
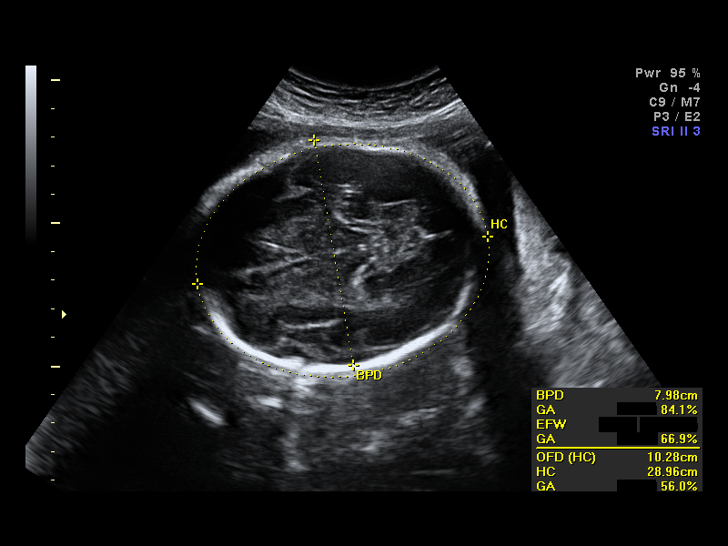

[14 of 28 positions shown; findings below may reference images not displayed]

IMPRESSION: [facility] OB/[facility] has also been faxed to the ordering physician.

## 2015-09-30 HISTORY — PX: ABDOMINAL HYSTERECTOMY: SHX81

## 2016-05-07 ENCOUNTER — Other Ambulatory Visit: Payer: Self-pay | Admitting: Obstetrics

## 2016-05-09 LAB — CYTOLOGY - PAP

## 2016-08-29 NOTE — Patient Instructions (Addendum)
Your procedure is scheduled on:  Wednesday, Dec. 13, 2017  [facility]  7:00 AM  Pick up the phone at the desk and dial [phone].  Call this number if you have problems the morning of surgery: [phone].  Remember: Do NOT eat food or drink after:  Midnight Tuesday, Dec. 12, 2017  Take these medicines the morning of surgery with a SIP OF WATER:  Prozac, Vesicare  Stop ALL herbal medications at this time   Do NOT wear jewelry (body piercing), metal hair clips/bobby pins, make-up, or nail polish. Do NOT wear lotions, powders, or perfumes.  You may wear deodorant. Do NOT shave for 48 hours prior to surgery. Do NOT bring valuables to the hospital. Contacts, dentures, or bridgework may not be worn into surgery.  Leave suitcase in car.  After surgery it may be brought to your room.  For patients admitted to the hospital, checkout time is 11:00 AM the day of discharge.  Bring a copy of healthcare power of attorney and living will paper work day of surgery

## 2016-09-02 ENCOUNTER — Encounter (HOSPITAL_COMMUNITY)
Admission: RE | Admit: 2016-09-02 | Discharge: 2016-09-02 | Disposition: A | Discharge status: Home / Self Care | Payer: Commercial Managed Care - PPO | Source: Ambulatory Visit | Attending: Obstetrics | Admitting: Obstetrics

## 2016-09-02 ENCOUNTER — Encounter (HOSPITAL_COMMUNITY): Payer: Self-pay

## 2016-09-02 DIAGNOSIS — Z01812 Encounter for preprocedural laboratory examination: Secondary | ICD-10-CM | POA: Diagnosis present

## 2016-09-02 HISTORY — DX: Major depressive disorder, single episode, unspecified: F32.9

## 2016-09-02 HISTORY — DX: Depression, unspecified: F32.A

## 2016-09-02 HISTORY — DX: Anxiety disorder, unspecified: F41.9

## 2016-09-02 LAB — BASIC METABOLIC PANEL
ANION GAP: 8 (ref 5–15)
BUN: 21 mg/dL — AB (ref 6–20)
CHLORIDE: 105 mmol/L (ref 101–111)
CO2: 23 mmol/L (ref 22–32)
Calcium: 8.9 mg/dL (ref 8.9–10.3)
Creatinine, Ser: 0.74 mg/dL (ref 0.44–1.00)
GFR calc non Af Amer: >60 mL/min (ref >60)
Glucose, Bld: 85 mg/dL (ref 65–99)
POTASSIUM: 4.2 mmol/L (ref 3.5–5.1)
SODIUM: 136 mmol/L (ref 135–145)

## 2016-09-02 LAB — ABO/RH: ABO/RH(D): O POS

## 2016-09-02 LAB — TYPE AND SCREEN
ABO/RH(D): O POS
Antibody Screen: NEGATIVE

## 2016-09-02 LAB — CBC
HEMATOCRIT: 37.5 % (ref 36.0–46.0)
HEMOGLOBIN: 12.9 g/dL (ref 12.0–15.0)
MCH: 30.9 pg (ref 26.0–34.0)
MCHC: 34.4 g/dL (ref 30.0–36.0)
MCV: 89.9 fL (ref 78.0–100.0)
Platelets: 267 10*3/uL (ref 150–400)
RBC: 4.17 MIL/uL (ref 3.87–5.11)
RDW: 12.7 % (ref 11.5–15.5)
WBC: 7.7 10*3/uL (ref 4.0–10.5)

## 2016-09-09 ENCOUNTER — Encounter (HOSPITAL_COMMUNITY): Payer: Self-pay | Admitting: Obstetrics

## 2016-09-09 NOTE — H&P (Signed)
40 y.o. Ellen BenderG3P3 presents for hysterectomy for heavy menstrual bleeding and dyspareunia.  Bleeds x 7d, heavy flow, some flooding. Dyspareunia x years, worsening over last 6 months. Recent trial of OCPs--had some but not complete, improvement of pain.   Decreased menstrual flow, but was unable to tolerate the breakthrough bleeding.  Also with deep dyspareunia and cervical motion tenderness.  Negative screen for sexually transmitted infections.   Recent US w 8 x 5 x 4 cm uterus, myometrium is heterogenous in appearance. Ovaries--normal RO, LO w 1.8 cm hemorrhagic cyst.  Past Medical History:  Diagnosis Date  . Anxiety   . Depression     Past Surgical History:  Procedure Laterality Date  . BREAST ENHANCEMENT SURGERY    . DIAGNOSTIC LAPAROSCOPY    . WISDOM TOOTH EXTRACTION      OB History  Gravida Para Term Preterm AB Living  3 3          SAB TAB Ectopic Multiple Live Births               # Outcome Date GA Lbr Len/2nd Weight Sex Delivery Anes PTL Lv  3 Para           2 Para           1 Para               Social History   Social History  . Marital status: Married    Spouse name: N/A  . Number of children: N/A  . Years of education: N/A   Occupational History  . Not on file.   Social History Main Topics  . Smoking status: Never Smoker  . Smokeless tobacco: Never Used  . Alcohol use 3.0 - 6.0 oz/week    5 - 10 Cans of beer per week  . Drug use: No  . Sexual activity: Not on file   Other Topics Concern  . Not on file   Social History Narrative  . No narrative on file   Patient has no known allergies.    Vitals:   09/10/16 0720  BP: 126/82  Pulse: 60  Resp: 18  Temp: 98.2 F (36.8 C)     General:  NAD Abdomen:  Soft Pelvic: uterus anteverted, mobile, mild CMT.  No abnl discharge.   Ex:  no edema   UPT negative  A/P   40 y.o. G3P3 with heavy menstrual bleeding and dyspareunia.  Desires definitive surgical management for HMB.  Has declined referral for pelvic  floor PT for dyspareunia.   Plan for LAVH / b/l salpingectomy, possible USO if indicated by surgical findings, possible cystoscopy. US w small hemorrhagic cyst on left ovary. USO only if suspicious intraoperatively. Discussed risks to include infection, bleeding, damage to surrounding structures (including, but not limited to bowel, bladder, ureters, ovaries, nerves, vessels), need for additional procedures, incomplete resolution of pelvic pain, vte, need for blood transfusion, anesthesia complications.  She understands that she may have residual dyspareunia following hysterectomy that may require alternative means of management such as pelvic floor PT.  She also has mild anterior and posterior vaginal wall prolapse.  It is not bothersome and she does not desire surgical correct at this time. She desires to proceed.     [facility]YANNA GEFFEL [employer] [facility]

## 2016-09-10 ENCOUNTER — Ambulatory Visit (HOSPITAL_COMMUNITY): Payer: Commercial Managed Care - PPO | Admitting: Anesthesiology

## 2016-09-10 ENCOUNTER — Observation Stay (HOSPITAL_COMMUNITY)
Admission: RE | Admit: 2016-09-10 | Discharge: 2016-09-11 | Disposition: A | Discharge status: Home / Self Care | Payer: Commercial Managed Care - PPO | Source: Ambulatory Visit | Attending: Obstetrics | Admitting: Obstetrics

## 2016-09-10 ENCOUNTER — Encounter (HOSPITAL_COMMUNITY)
Admission: RE | Disposition: A | Discharge status: Home / Self Care | Payer: Self-pay | Source: Ambulatory Visit | Attending: Obstetrics

## 2016-09-10 ENCOUNTER — Encounter (HOSPITAL_COMMUNITY): Payer: Self-pay | Admitting: Anesthesiology

## 2016-09-10 DIAGNOSIS — N920 Excessive and frequent menstruation with regular cycle: Secondary | ICD-10-CM | POA: Diagnosis not present

## 2016-09-10 DIAGNOSIS — N72 Inflammatory disease of cervix uteri: Secondary | ICD-10-CM | POA: Diagnosis not present

## 2016-09-10 DIAGNOSIS — F329 Major depressive disorder, single episode, unspecified: Secondary | ICD-10-CM | POA: Diagnosis not present

## 2016-09-10 DIAGNOSIS — N941 Unspecified dyspareunia: Secondary | ICD-10-CM | POA: Insufficient documentation

## 2016-09-10 DIAGNOSIS — N800 Endometriosis of uterus: Secondary | ICD-10-CM | POA: Insufficient documentation

## 2016-09-10 DIAGNOSIS — F419 Anxiety disorder, unspecified: Secondary | ICD-10-CM | POA: Diagnosis not present

## 2016-09-10 DIAGNOSIS — Z9889 Other specified postprocedural states: Secondary | ICD-10-CM

## 2016-09-10 HISTORY — PX: LAPAROSCOPIC VAGINAL HYSTERECTOMY WITH SALPINGECTOMY: SHX6680

## 2016-09-10 LAB — PREGNANCY, URINE: PREG TEST UR: NEGATIVE

## 2016-09-10 SURGERY — HYSTERECTOMY, VAGINAL, LAPAROSCOPY-ASSISTED, WITH SALPINGECTOMY
Anesthesia: General | Site: Abdomen

## 2016-09-10 MED ORDER — ONDANSETRON HCL 4 MG PO TABS: 4.0000 mg | ORAL_TABLET | Freq: Four times a day (QID) | ORAL | Status: DC | PRN

## 2016-09-10 MED ORDER — ONDANSETRON HCL 4 MG/2ML IJ SOLN: 4.0000 mg | Freq: Four times a day (QID) | INTRAMUSCULAR | Status: DC | PRN

## 2016-09-10 MED ORDER — CEFAZOLIN SODIUM-DEXTROSE 2-4 GM/100ML-% IV SOLN
2.0000 g | INTRAVENOUS | Status: AC
  Administered 2016-09-10: 2 g via INTRAVENOUS

## 2016-09-10 MED ORDER — LIDOCAINE HCL (CARDIAC) 20 MG/ML IV SOLN
INTRAVENOUS | Status: DC | PRN
  Administered 2016-09-10: 50 mg via INTRAVENOUS

## 2016-09-10 MED ORDER — HYDROMORPHONE HCL 1 MG/ML IJ SOLN
INTRAMUSCULAR | Status: AC
  Administered 2016-09-10: 0.5 mg via INTRAVENOUS
  Filled 2016-09-10: qty 1

## 2016-09-10 MED ORDER — BUPIVACAINE HCL (PF) 0.25 % IJ SOLN
INTRAMUSCULAR | Status: AC
  Filled 2016-09-10: qty 30

## 2016-09-10 MED ORDER — VASOPRESSIN 20 UNIT/ML IV SOLN
INTRAVENOUS | Status: AC
  Filled 2016-09-10: qty 1

## 2016-09-10 MED ORDER — SUGAMMADEX SODIUM 200 MG/2ML IV SOLN
INTRAVENOUS | Status: DC | PRN
  Administered 2016-09-10: 150 mg via INTRAVENOUS

## 2016-09-10 MED ORDER — OXYCODONE HCL 5 MG PO TABS
5.0000 mg | ORAL_TABLET | ORAL | Status: DC | PRN
  Administered 2016-09-10 (×2): 10 mg via ORAL
  Administered 2016-09-11 (×2): 5 mg via ORAL
  Administered 2016-09-11: 10 mg via ORAL
  Filled 2016-09-10 (×3): qty 2
  Filled 2016-09-10 (×2): qty 1

## 2016-09-10 MED ORDER — ONDANSETRON HCL 4 MG/2ML IJ SOLN
INTRAMUSCULAR | Status: DC | PRN
Start: 2016-09-10 — End: 2016-09-10
  Administered 2016-09-10: 4 mg via INTRAVENOUS

## 2016-09-10 MED ORDER — ROCURONIUM BROMIDE 100 MG/10ML IV SOLN
INTRAVENOUS | Status: DC | PRN
  Administered 2016-09-10: 10 mg via INTRAVENOUS
  Administered 2016-09-10: 50 mg via INTRAVENOUS

## 2016-09-10 MED ORDER — BUPIVACAINE HCL (PF) 0.25 % IJ SOLN
INTRAMUSCULAR | Status: DC | PRN
  Administered 2016-09-10: 6 mL

## 2016-09-10 MED ORDER — FENTANYL CITRATE (PF) 250 MCG/5ML IJ SOLN
INTRAMUSCULAR | Status: AC
  Filled 2016-09-10: qty 5

## 2016-09-10 MED ORDER — PROPOFOL 10 MG/ML IV BOLUS
INTRAVENOUS | Status: DC | PRN
  Administered 2016-09-10: 200 mg via INTRAVENOUS

## 2016-09-10 MED ORDER — HYDROMORPHONE HCL 1 MG/ML IJ SOLN
0.5000 mg | INTRAMUSCULAR | Status: DC | PRN
  Administered 2016-09-10: 0.5 mg via INTRAVENOUS
  Filled 2016-09-10: qty 1

## 2016-09-10 MED ORDER — DEXAMETHASONE SODIUM PHOSPHATE 10 MG/ML IJ SOLN
INTRAMUSCULAR | Status: DC | PRN
  Administered 2016-09-10: 4 mg via INTRAVENOUS

## 2016-09-10 MED ORDER — MIDAZOLAM HCL 2 MG/2ML IJ SOLN
INTRAMUSCULAR | Status: DC | PRN
  Administered 2016-09-10: 2 mg via INTRAVENOUS

## 2016-09-10 MED ORDER — DOCUSATE SODIUM 100 MG PO CAPS
100.0000 mg | ORAL_CAPSULE | Freq: Two times a day (BID) | ORAL | Status: DC
  Administered 2016-09-10 – 2016-09-11 (×2): 100 mg via ORAL
  Filled 2016-09-10 (×2): qty 1

## 2016-09-10 MED ORDER — IBUPROFEN 600 MG PO TABS
600.0000 mg | ORAL_TABLET | Freq: Four times a day (QID) | ORAL | Status: DC
  Administered 2016-09-10 – 2016-09-11 (×4): 600 mg via ORAL
  Filled 2016-09-10 (×4): qty 1

## 2016-09-10 MED ORDER — LACTATED RINGERS IV SOLN
INTRAVENOUS | Status: DC
  Administered 2016-09-10 (×2): via INTRAVENOUS

## 2016-09-10 MED ORDER — OXYCODONE HCL 5 MG PO TABS: 5.0000 mg | ORAL_TABLET | Freq: Once | ORAL | Status: DC | PRN

## 2016-09-10 MED ORDER — SIMETHICONE 80 MG PO CHEW
80.0000 mg | CHEWABLE_TABLET | Freq: Four times a day (QID) | ORAL | Status: DC | PRN
  Administered 2016-09-10 (×2): 80 mg via ORAL
  Filled 2016-09-10 (×2): qty 1

## 2016-09-10 MED ORDER — SODIUM CHLORIDE 0.9 % IJ SOLN
INTRAMUSCULAR | Status: AC
Start: 2016-09-10 — End: 2016-09-10
  Filled 2016-09-10: qty 100

## 2016-09-10 MED ORDER — FLUOXETINE HCL 20 MG PO CAPS
40.0000 mg | ORAL_CAPSULE | Freq: Every day | ORAL | Status: DC
  Administered 2016-09-11: 40 mg via ORAL
  Filled 2016-09-10 (×2): qty 2

## 2016-09-10 MED ORDER — SCOPOLAMINE 1 MG/3DAYS TD PT72
MEDICATED_PATCH | TRANSDERMAL | Status: AC
  Administered 2016-09-10: 1.5 mg via TRANSDERMAL
  Filled 2016-09-10: qty 1

## 2016-09-10 MED ORDER — HYDROMORPHONE HCL 1 MG/ML IJ SOLN
0.2500 mg | INTRAMUSCULAR | Status: DC | PRN
  Administered 2016-09-10 (×4): 0.5 mg via INTRAVENOUS

## 2016-09-10 MED ORDER — MENTHOL 3 MG MT LOZG: 1.0000 | LOZENGE | OROMUCOSAL | Status: DC | PRN

## 2016-09-10 MED ORDER — KETOROLAC TROMETHAMINE 30 MG/ML IJ SOLN
INTRAMUSCULAR | Status: DC | PRN
  Administered 2016-09-10: 30 mg via INTRAVENOUS

## 2016-09-10 MED ORDER — LACTATED RINGERS IV SOLN
INTRAVENOUS | Status: DC
  Administered 2016-09-10: 08:00:00 via INTRAVENOUS
  Administered 2016-09-10: 125 mL/h via INTRAVENOUS

## 2016-09-10 MED ORDER — VASOPRESSIN 20 UNIT/ML IJ SOLN
INTRAMUSCULAR | Status: DC | PRN
  Administered 2016-09-10: 20 [IU]

## 2016-09-10 MED ORDER — FENTANYL CITRATE (PF) 100 MCG/2ML IJ SOLN
INTRAMUSCULAR | Status: DC | PRN
  Administered 2016-09-10: 50 ug via INTRAVENOUS
  Administered 2016-09-10 (×2): 100 ug via INTRAVENOUS

## 2016-09-10 MED ORDER — MIDAZOLAM HCL 2 MG/2ML IJ SOLN
INTRAMUSCULAR | Status: AC
  Filled 2016-09-10: qty 2

## 2016-09-10 MED ORDER — SODIUM CHLORIDE 0.9 % IJ SOLN
INTRAMUSCULAR | Status: DC | PRN
  Administered 2016-09-10: 100 mL via INTRAVENOUS

## 2016-09-10 MED ORDER — OXYCODONE HCL 5 MG/5ML PO SOLN: 5.0000 mg | Freq: Once | ORAL | Status: DC | PRN

## 2016-09-10 MED ORDER — SCOPOLAMINE 1 MG/3DAYS TD PT72
1.0000 | MEDICATED_PATCH | Freq: Once | TRANSDERMAL | Status: DC
  Administered 2016-09-10: 1.5 mg via TRANSDERMAL

## 2016-09-10 SURGICAL SUPPLY — 41 items
CLOTH BEACON ORANGE TIMEOUT ST (SAFETY) ×4 IMPLANT
COVER BACK TABLE 60X90IN (DRAPES) ×4 IMPLANT
DECANTER SPIKE VIAL GLASS SM (MISCELLANEOUS) ×8 IMPLANT
DERMABOND ADVANCED (GAUZE/BANDAGES/DRESSINGS)
DERMABOND ADVANCED .7 DNX12 (GAUZE/BANDAGES/DRESSINGS) IMPLANT
DRSG OPSITE POSTOP 3X4 (GAUZE/BANDAGES/DRESSINGS) ×4 IMPLANT
DURAPREP 26ML APPLICATOR (WOUND CARE) ×4 IMPLANT
ELECT REM PT RETURN 9FT ADLT (ELECTROSURGICAL) ×4
ELECTRODE REM PT RTRN 9FT ADLT (ELECTROSURGICAL) ×2 IMPLANT
GLOVE BIO SURGEON STRL SZ 6 (GLOVE) ×8 IMPLANT
GLOVE BIOGEL PI IND STRL 6.5 (GLOVE) ×6 IMPLANT
GLOVE BIOGEL PI IND STRL 7.0 (GLOVE) ×4 IMPLANT
GLOVE BIOGEL PI INDICATOR 6.5 (GLOVE) ×6
GLOVE BIOGEL PI INDICATOR 7.0 (GLOVE) ×4
HEMOSTAT ARISTA ABSORB 1G (MISCELLANEOUS) ×4 IMPLANT
HEMOSTAT ARISTA ABSORB 3G PWDR (MISCELLANEOUS) ×4 IMPLANT
LEGGING LITHOTOMY PAIR STRL (DRAPES) ×4 IMPLANT
LIGASURE VESSEL 5MM BLUNT TIP (ELECTROSURGICAL) ×4 IMPLANT
NS IRRIG 1000ML POUR BTL (IV SOLUTION) ×4 IMPLANT
PACK LAVH (CUSTOM PROCEDURE TRAY) ×4 IMPLANT
PACK ROBOTIC GOWN (GOWN DISPOSABLE) ×4 IMPLANT
PACK TRENDGUARD 450 HYBRID PRO (MISCELLANEOUS) IMPLANT
PROTECTOR NERVE ULNAR (MISCELLANEOUS) ×8 IMPLANT
SET CYSTO W/LG BORE CLAMP LF (SET/KITS/TRAYS/PACK) ×4 IMPLANT
SET IRRIG TUBING LAPAROSCOPIC (IRRIGATION / IRRIGATOR) ×4 IMPLANT
SLEEVE XCEL OPT CAN 5 100 (ENDOMECHANICALS) ×4 IMPLANT
SUT MON AB 4-0 PS1 27 (SUTURE) ×4 IMPLANT
SUT VIC AB 0 CT1 18XCR BRD8 (SUTURE) ×4 IMPLANT
SUT VIC AB 0 CT1 27 (SUTURE) ×4
SUT VIC AB 0 CT1 27XBRD ANBCTR (SUTURE) ×4 IMPLANT
SUT VIC AB 0 CT1 8-18 (SUTURE) ×4
SUT VIC AB 2-0 CT1 (SUTURE) ×4 IMPLANT
SUT VICRYL 0 TIES 12 18 (SUTURE) ×4 IMPLANT
SUT VICRYL 0 UR6 27IN ABS (SUTURE) ×4 IMPLANT
TOWEL OR 17X24 6PK STRL BLUE (TOWEL DISPOSABLE) ×8 IMPLANT
TRAY FOLEY CATH SILVER 14FR (SET/KITS/TRAYS/PACK) ×4 IMPLANT
TRENDGUARD 450 HYBRID PRO PACK (MISCELLANEOUS)
TROCAR OPTI TIP 5M 100M (ENDOMECHANICALS) ×4 IMPLANT
TROCAR XCEL NON-BLD 11X100MML (ENDOMECHANICALS) ×4 IMPLANT
TROCAR XCEL NON-BLD 5MMX100MML (ENDOMECHANICALS) ×4 IMPLANT
WARMER LAPAROSCOPE (MISCELLANEOUS) ×4 IMPLANT

## 2016-09-10 NOTE — Progress Notes (Signed)
Pt seen and examined.  Doing well.  No n/v.  Tolerating liquids.   Reports moderate pain not completely relieved w oxycodone.  Has not yet ambulated  BP 123/72   Pulse (!) 56   Temp 98.3 F (36.8 C) (Oral)   Resp 16   Ht 5\' 2"  (1.575 m)   Wt 60.8 kg (134 lb)   LMP 08/09/2016 (Exact Date)   SpO2 100%   BMI 24.51 kg/m  Uop:  adeqate  NAD Abd:  Soft, appropriately tender to palpation, incisions c/d/i SCDs  A&P: POD#0 s/p LAVH / b/l salpingectomy  Doing well Motrin now for pain, IV meds prn Ambulate this PM Will d/c IVF when PO intake increases.

## 2016-09-10 NOTE — Anesthesia Postprocedure Evaluation (Signed)
Anesthesia Post Note  Patient: Ellen StarcherLisa M Mendoza  Procedure(s) Performed: Procedure(s) (LRB): LAPAROSCOPIC ASSISTED VAGINAL HYSTERECTOMY WITH SALPINGECTOMY (Bilateral)  Patient location during evaluation: PACU Anesthesia Type: General Level of consciousness: awake and alert and patient cooperative Pain management: pain level controlled Vital Signs Assessment: post-procedure vital signs reviewed and stable Respiratory status: spontaneous breathing and respiratory function stable Cardiovascular status: stable Anesthetic complications: no     Last Vitals:  Vitals:   09/10/16 1155 09/10/16 1300  BP: 117/75 113/80  Pulse: 77 71  Resp: 16 16  Temp: 37.4 C 37.2 C    Last Pain:  Vitals:   09/10/16 1300  TempSrc: Oral  PainSc:    Pain Goal: Patients Stated Pain Goal: 3 (09/10/16 1206)               Mackensey Bolte S

## 2016-09-10 NOTE — Addendum Note (Signed)
Addendum  created 09/10/16 1646 by Algis GreenhouseLinda A Gabrella Stroh, CRNA   Charge Capture section accepted

## 2016-09-10 NOTE — Addendum Note (Signed)
Addendum  created 09/10/16 1632 by Graciela HusbandsWynn O Irby Fails, CRNA   Sign clinical note

## 2016-09-10 NOTE — Brief Op Note (Signed)
09/10/2016  1:52 PM  PATIENT:  Ellen Mendoza  40 y.o. female  PRE-OPERATIVE DIAGNOSIS:  MENORRHAGIA  POST-OPERATIVE DIAGNOSIS:  MENORRHAGIA  PROCEDURE:  Procedure(s): LAPAROSCOPIC ASSISTED VAGINAL HYSTERECTOMY WITH SALPINGECTOMY (Bilateral)  SURGEON:  Surgeon(s) and Role:    * Marlow Baarsyanna Hatsue Sime, MD - Primary    * Carrington ClampMichelle Horvath, MD - Assisting  ANESTHESIA:   general  EBL:  Total I/O In: 2300 [I.V.:2300] Out: 300 [Urine:200; Blood:100]  BLOOD ADMINISTERED:none  DRAINS: none   LOCAL MEDICATIONS USED:  MARCAINE    and Amount: 10 ml  SPECIMEN:  Source of Specimen:  uterus, cervix, bilateral tubes  DISPOSITION OF SPECIMEN:  PATHOLOGY  COUNTS:  YES  TOURNIQUET:  * No tourniquets in log *  DICTATION: .Note written in EPIC  PLAN OF CARE: Admit for overnight observation  PATIENT DISPOSITION:  PACU - hemodynamically stable.   Delay start of Pharmacological VTE agent (>24hrs) due to surgical blood loss or risk of bleeding: not applicable

## 2016-09-10 NOTE — Anesthesia Postprocedure Evaluation (Signed)
Anesthesia Post Note  Patient: Ellen StarcherLisa M Mendoza  Procedure(s) Performed: Procedure(s) (LRB): LAPAROSCOPIC ASSISTED VAGINAL HYSTERECTOMY WITH SALPINGECTOMY (Bilateral)  Patient location during evaluation: [facility] Anesthesia Type: General Level of consciousness: awake and alert Pain management: satisfactory to patient Vital Signs Assessment: post-procedure vital signs reviewed and stable Respiratory status: spontaneous breathing and respiratory function stable Cardiovascular status: stable Postop Assessment: adequate PO intake Anesthetic complications: no     Last Vitals:  Vitals:   09/10/16 1405 09/10/16 1500  BP: 116/70 122/71  Pulse: 69 67  Resp: 16 16  Temp: 37.2 C 37.2 C    Last Pain:  Vitals:   09/10/16 1500  TempSrc: Oral  PainSc:    Pain Goal: Patients Stated Pain Goal: 3 (09/10/16 1310)               Tymeka Privette

## 2016-09-10 NOTE — Transfer of Care (Signed)
Immediate Anesthesia Transfer of Care Note  Patient: Ellen StarcherLisa M Mendoza  Procedure(s) Performed: Procedure(s): LAPAROSCOPIC ASSISTED VAGINAL HYSTERECTOMY WITH SALPINGECTOMY (Bilateral)  Patient Location: PACU  Anesthesia Type:General  Level of Consciousness: awake, alert  and oriented  Airway & Oxygen Therapy: Patient Spontanous Breathing and Patient connected to nasal cannula oxygen  Post-op Assessment: Report given to RN and Post -op Vital signs reviewed and stable  Post vital signs: Reviewed and stable  Last Vitals:  Vitals:   09/10/16 0720  BP: 126/82  Pulse: 60  Resp: 18  Temp: 36.8 C    Last Pain:  Vitals:   09/10/16 0720  TempSrc: Oral      Patients Stated Pain Goal: 3 (09/10/16 0720)  Complications: No apparent anesthesia complications

## 2016-09-10 NOTE — Op Note (Signed)
PRE-OPERATIVE DIAGNOSIS:  Heavy menstrual bleeding and dyspareunia  POST-OPERATIVE DIAGNOSIS:  Heavy menstrual bleeding and dyspareunia  PROCEDURE:  Procedure(s): LAPAROSCOPIC ASSISTED VAGINAL HYSTERECTOMY WITH SALPINGECTOMY (Bilateral)  SURGEON:  Surgeon(s) and Role:    * Marlow Baarsyanna Nashly Olsson, MD - Primary    * Carrington ClampMichelle Horvath, MD - Assisting  ANESTHESIA:   General endotracheal anesthesia, 10 cc of 0.25% Marcaine injected infraumbilically and 20 cc of dilute pitressin  EBL:  Total I/O In: 2300 [I.V.:2300] Out: 300 [Urine:200; Blood:100]  BLOOD ADMINISTERED:none  DRAINS: none   SPECIMEN:  Source of Specimen:  uterus, cervix, bilateral tubes  DISPOSITION OF SPECIMEN:  PATHOLOGY  Operative Findings: Normal anteverted uterus, normal fallopian tubes and ovaries bilaterally.  No evidence of endometriosis or intra-abdominal adhesions.   Specimen: uterus, cervix, bilateral tubes  Description of the Procedure:  The patient was taken to the operating room, where general endotracheal anesthesia was obtained without difficulty. She was placed in the dorsal lithotomy position in [place] stirrups and exam under anesthesia was performed, and a small mobile uterus was appreciated. The patient was prepped and draped in the normal sterile fashion. A Foley catheter was inserted sterilely into the bladder. A bivalve speculum was placed into the vagina and acorn uterine manipulator was placed without difficulty. Attention was then turned to the abdomen. The scalp was used to make a 10 mm vertical incision at the base of the umbilicus. The 10/11 mm Optiview trocar was used to enter the abdomen under direct visualization. Entry was confirmed and the abdomen was insufflated with carbon dioxide. The abdomen was surveyed and findings noted as above. Under direct visualization, two additional 5-mm ports were placed into the right and left lower  quadrant. The uterus was elevated out of the pelvis.    The  LigaSure was then introduced into the abdomen. The left ureter was identified.  The left fallopian tube was grasped and the LigaSure was used to sequentially clamp, cut, and burn the mesosalpinx. The round ligament and utero-ovarian were then taken in a similar fashion. The anterior leaf of the broad ligament was developed with the LigaSure and dissected down to the level of the bladder. The posterior leaf was taken in a similar manner. Attention was then turned to the right side. In a similar manner, the LigaSure was used to sequentially clamp, burn the mesosalpinx, freeing up the right fallopian tube.  The round ligament and then the right utero-ovarian were clamped, cut and burned with the LigaSure.   The broad ligament down to level of the uterine arteries.  bladder flap was developed using the hot scissors.   At this point, the decision was made to turn the procedure to a vaginal approach.  All instruments were removed from the abdomen. Attention was turned to the vagina. The acorn uterine manipulator was removed. The cervix was grasped with two Loman BrooklynJacobs tenacula and injected circumferentially with dilute pitressin.  Bovie cautery was used to circumferentially incise the cervicovaginal junction. The posterior cul-de-sac was entered into with Mayo scissors. The long billed duckbill retractor was then placed into the peritoneal cavity.  The the uterosacrals were grasped with a pair heney clamps on either side and secured with a Heaney stitch of 0 Vicryl. The bladder was carefully dissected off of the cervix with sharp dissection with the Metzenbaums.   Curved Heaneys were obtained to sequentially clamp, cut, and suture ligate working up the cardinal ligaments until entry could be gained anteriorly into the peritoneum.  At the level of the cornua,  Heaneys were placed bilaterally around the entire pedicle and the uterus was able to be amputated.  The pedicles were secured with a free tie of 0 vicryl.   The  peritoneum was then closed in a pursestring fashion, including the bilateral uterosacrals and the posterior vagina in a modified McCall's culdoplasty.  The stitch was pulled taut closing the peritoneum and supporting the vaginal cuff.  The vaginal cuff was closed with#0 Vicryl in a running, locked fashion.    Attention was then turned back to the abdomen. The camera was reinserted. The abdomen was surveyed. It was copiously irrigated.  The pedicles on the left and right side were examined and noted to be hemostatic. The vaginal cuff was re-examined.  The was a small amount of oozing from the midportion of the cuff.  Monopolar cautery applied with improvement in bleeding.  Arista was placed at the cuff and bilaterally along the pedicles.    At this time, all instruments were removed from the abdomen.  The fascia at the umbilicus was closed with 0-vicryl in a figure of eight stitch.  The skin incisions were closed with 4-0 Monocryl in a subcuticular fashion followed by Dermabond. The patient tolerated all portions of procedure well. Sponge, lap, and needle count were correct x2.      .Marland Kitchen

## 2016-09-10 NOTE — Anesthesia Preprocedure Evaluation (Addendum)
Anesthesia Evaluation  Patient identified by MRN, date of birth, ID band Patient awake    Reviewed: Allergy & Precautions, H&P , NPO status , Patient's Chart, lab work & pertinent test results  Airway Mallampati: I  TM Distance: >3 FB Neck ROM: full    Dental  (+) Teeth Intact   Pulmonary neg pulmonary ROS,    breath sounds clear to auscultation       Cardiovascular negative cardio ROS   Rhythm:regular Rate:Normal     Neuro/Psych PSYCHIATRIC DISORDERS Anxiety Depression negative neurological ROS     GI/Hepatic negative GI ROS, Neg liver ROS,   Endo/Other  negative endocrine ROS  Renal/GU negative Renal ROS     Musculoskeletal negative musculoskeletal ROS (+)   Abdominal   Peds  Hematology negative hematology ROS (+)   Anesthesia Other Findings   Reproductive/Obstetrics negative OB ROS                            Anesthesia Physical Anesthesia Plan  ASA: II  Anesthesia Plan: General   Post-op Pain Management:    Induction: Intravenous  Airway Management Planned: Oral ETT  Additional Equipment:   Intra-op Plan:   Post-operative Plan: Extubation in OR  Informed Consent: I have reviewed the patients History and Physical, chart, labs and discussed the procedure including the risks, benefits and alternatives for the proposed anesthesia with the patient or authorized representative who has indicated his/her understanding and acceptance.     Plan Discussed with: CRNA, Anesthesiologist and Surgeon  Anesthesia Plan Comments:        Anesthesia Quick Evaluation

## 2016-09-10 NOTE — Anesthesia Procedure Notes (Signed)
Procedure Name: Intubation Date/Time: 09/10/2016 8:25 AM Performed by: Cleda ClarksBROWDER, Saxon Crosby R Pre-anesthesia Checklist: Patient identified, Emergency Drugs available, Suction available and Patient being monitored Patient Re-evaluated:Patient Re-evaluated prior to inductionOxygen Delivery Method: Circle system utilized Preoxygenation: Pre-oxygenation with 100% oxygen Intubation Type: IV induction Ventilation: Mask ventilation without difficulty Tube type: Oral Tube size: 7.0 mm Number of attempts: 1 Airway Equipment and Method: Stylet and Oral airway Placement Confirmation: ETT inserted through vocal cords under direct vision,  positive ETCO2 and breath sounds checked- equal and bilateral Secured at: 20 cm Tube secured with: Tape Dental Injury: Teeth and Oropharynx as per pre-operative assessment

## 2016-09-11 DIAGNOSIS — N920 Excessive and frequent menstruation with regular cycle: Secondary | ICD-10-CM | POA: Diagnosis not present

## 2016-09-11 LAB — BASIC METABOLIC PANEL
Anion gap: 4 — ABNORMAL LOW (ref 5–15)
BUN: 7 mg/dL (ref 6–20)
CO2: 29 mmol/L (ref 22–32)
Calcium: 7.8 mg/dL — ABNORMAL LOW (ref 8.9–10.3)
Chloride: 105 mmol/L (ref 101–111)
Creatinine, Ser: 0.68 mg/dL (ref 0.44–1.00)
GFR calc Af Amer: >60 mL/min (ref >60)
GFR calc non Af Amer: >60 mL/min (ref >60)
Glucose, Bld: 86 mg/dL (ref 65–99)
Potassium: 3.9 mmol/L (ref 3.5–5.1)
SODIUM: 138 mmol/L (ref 135–145)

## 2016-09-11 LAB — CBC
HEMATOCRIT: 32.3 % — AB (ref 36.0–46.0)
HEMOGLOBIN: 10.7 g/dL — AB (ref 12.0–15.0)
MCH: 30.5 pg (ref 26.0–34.0)
MCHC: 33.1 g/dL (ref 30.0–36.0)
MCV: 92 fL (ref 78.0–100.0)
Platelets: 180 10*3/uL (ref 150–400)
RBC: 3.51 MIL/uL — AB (ref 3.87–5.11)
RDW: 12.9 % (ref 11.5–15.5)
WBC: 7.7 10*3/uL (ref 4.0–10.5)

## 2016-09-11 MED ORDER — OXYCODONE HCL 5 MG PO TABS: 5.0000 mg | ORAL_TABLET | Freq: Four times a day (QID) | ORAL | 0 refills | Status: AC | PRN

## 2016-09-11 MED ORDER — IBUPROFEN 600 MG PO TABS: 600.0000 mg | ORAL_TABLET | Freq: Four times a day (QID) | ORAL | 0 refills | Status: AC

## 2016-09-11 NOTE — Progress Notes (Signed)
Doing well.  No n/v.  Tolerating PO.  Is passing flatus.  Has ambulated in room w no dizziness or SOB.   Foley catheter removed this AM and has voided.  Reports cramping abdominal pain, more than anticipated.  PO pain meds are helping.  Feels better w walking than w sitting  BP 139/83 (BP Location: Right Arm)   Pulse 60   Temp 98.4 F (36.9 C) (Oral)   Resp 18   Ht 5\' 2"  (1.575 m)   Wt 60.8 kg (134 lb)   LMP 08/09/2016 (Exact Date)   SpO2 97%   BMI 24.51 kg/m  Uop:  adeqate  NAD Abd:  Soft, appropriately tender to palpation, incisions c/d/i Perineum--spotting on pad SCDs  Lab Results  Component Value Date   WBC 7.7 09/11/2016   HGB 10.7 (L) 09/11/2016   HCT 32.3 (L) 09/11/2016   MCV 92.0 09/11/2016   PLT 180 09/11/2016   Lab Results  Component Value Date   CREATININE 0.68 09/11/2016     A&P: POD#1 s/p LAVH / b/l salpingectomy  Doing well.  Meeting all goals. Ambulate in halls this AM, then d/c

## 2016-09-11 NOTE — Discharge Summary (Signed)
Physician Discharge Summary  Patient ID: Ellen StarcherLisa M Biggins MRN: [id number] DOB/AGE: 39/01/1976 39 y.o.  Admit date: 09/10/2016 Discharge date: 09/11/2016  Admission Diagnoses: Heavy menstrual bleeding and dyspareunia  Discharge Diagnoses:  Active Problems:   Heavy menstrual bleeding   Discharged Condition: good  Hospital Course: Patient was taken to the OR for LAVH, b/l salpingectomy.  She had a routine post operative course.  On POD#1, she ambulated without difficulty.  She tolerated PO without nausea or vomiting.  Her pain was controlled by po pain meds.  She was passing flatus.  Vaginal bleeding was scant.  Her post op cbc and bmp were normal.  She was meeting all goals for discharge.     Discharge Exam: Blood pressure 139/83, pulse 60, temperature 98.4 F (36.9 C), temperature source Oral, resp. rate 18, height 5\' 2"  (1.575 m), weight 60.8 kg (134 lb), last menstrual period 08/09/2016, SpO2 97 %. General appearance: alert, cooperative and appears stated age GI: soft, non-tender; bowel sounds normal; no masses,  no organomegaly Pelvic: scant vaginal bleeding on pad Incision/Wound: c/d/i, no erythema or drainage  Disposition:   Discharge Instructions    Call MD for:  difficulty breathing, headache or visual disturbances    Complete by:  As directed    Call MD for:  extreme fatigue    Complete by:  As directed    Call MD for:  hives    Complete by:  As directed    Call MD for:  persistant dizziness or light-headedness    Complete by:  As directed    Call MD for:  persistant nausea and vomiting    Complete by:  As directed    Call MD for:  redness, tenderness, or signs of infection (pain, swelling, redness, odor or green/yellow discharge around incision site)    Complete by:  As directed    Call MD for:  severe uncontrolled pain    Complete by:  As directed    Call MD for:  temperature >100.4    Complete by:  As directed    Driving restriction     Complete by:  As directed    Avoid driving for at least [id number] days.   Lifting restrictions    Complete by:  As directed    Weight restriction of 10 lbs.   No dressing needed    Complete by:  As directed    Sexual acrtivity    Complete by:  As directed    Pelvic rest x 6 weeks (no sex)       Medication List    TAKE these medications   FLUoxetine 40 MG capsule Commonly known as:  PROZAC Take 40 mg by mouth daily.   ibuprofen 600 MG tablet Commonly known as:  ADVIL,MOTRIN Take 1 tablet (600 mg total) by mouth every 6 (six) hours.   LORazepam 1 MG tablet Commonly known as:  ATIVAN Take 1 mg by mouth daily as needed for anxiety.   oxyCODONE 5 MG immediate release tablet Commonly known as:  Oxy IR/ROXICODONE Take 1-2 tablets (5-10 mg total) by mouth every 6 (six) hours as needed for severe pain.   solifenacin 10 MG tablet Commonly known as:  VESICARE Take 10 mg by mouth daily.   tretinoin 0.05 % cream Commonly known as:  RETIN-A Apply 1 application topically at bedtime.      Follow-up Information    [facility] Chestine SporeLARK, [facility] Follow up in 2 week(s).   Specialty:  Obstetrics Contact information: [address] [place] [address] [phone]           Signed: Carmelina DaneDYANNA GEFFEL [name] 09/11/2016, 9:27 AM

## 2016-09-11 NOTE — Discharge Instructions (Signed)
You may wash incision with soap and water.    Do not soak the incision (no tub baths or swimming).  If you note drainage, increased pain, or increased redness of the incision, then please notify your physician.  If you experience nausea, vomiting, shortness of breath, chest pain, heavy vaginal bleeding, please notify your physician.   Pelvic rest x 6 weeks (no intercourse or tampons)   No lifting over 10 lbs for 6 weeks.   Do not drive until you are not taking narcotic pain medication AND you can comfortably slam on the brakes.  Please use motrin every 6 hours for pain.  If not controlled you may add in tylenol every 6 hours as needed.  You make take oxycodone 5-10 mg every 6 hours as needed for severe breakthrough pain.

## 2016-09-12 ENCOUNTER — Encounter (HOSPITAL_COMMUNITY): Payer: Self-pay | Admitting: Obstetrics

## 2020-04-24 ENCOUNTER — Institutional Professional Consult (permissible substitution): Payer: Commercial Managed Care - PPO | Admitting: Plastic Surgery

## 2020-09-29 HISTORY — PX: BREAST ENHANCEMENT SURGERY: SHX7

## 2020-12-03 DIAGNOSIS — I471 Supraventricular tachycardia: Secondary | ICD-10-CM | POA: Diagnosis not present

## 2023-09-21 ENCOUNTER — Other Ambulatory Visit (HOSPITAL_COMMUNITY): Payer: Self-pay

## 2023-09-21 MED ORDER — WEGOVY 0.25 MG/0.5ML ~~LOC~~ SOAJ
SUBCUTANEOUS | 0 refills | Status: AC
  Filled 2023-09-21: qty 2, 28d supply, fill #0

## 2023-09-25 ENCOUNTER — Other Ambulatory Visit (HOSPITAL_COMMUNITY): Payer: Self-pay

## 2023-09-28 ENCOUNTER — Encounter (HOSPITAL_COMMUNITY): Payer: Self-pay

## 2023-09-28 ENCOUNTER — Other Ambulatory Visit (HOSPITAL_COMMUNITY): Payer: Self-pay

## 2023-10-02 ENCOUNTER — Other Ambulatory Visit (HOSPITAL_COMMUNITY): Payer: Self-pay

## 2023-10-14 ENCOUNTER — Encounter (HOSPITAL_COMMUNITY): Payer: Self-pay

## 2023-10-14 ENCOUNTER — Other Ambulatory Visit (HOSPITAL_COMMUNITY): Payer: Self-pay

## 2023-10-14 MED ORDER — WEGOVY 0.25 MG/0.5ML ~~LOC~~ SOAJ
SUBCUTANEOUS | 0 refills | Status: AC
  Filled 2023-10-14: qty 2, 28d supply, fill #0

## 2023-10-15 ENCOUNTER — Other Ambulatory Visit (HOSPITAL_COMMUNITY): Payer: Self-pay

## 2023-10-15 MED ORDER — WEGOVY 0.25 MG/0.5ML ~~LOC~~ SOAJ
0.2500 mg | SUBCUTANEOUS | 0 refills | Status: AC
  Filled 2023-10-15: qty 2, 28d supply, fill #0

## 2023-10-19 ENCOUNTER — Other Ambulatory Visit (HOSPITAL_COMMUNITY): Payer: Self-pay

## 2023-10-26 ENCOUNTER — Other Ambulatory Visit (HOSPITAL_COMMUNITY): Payer: Self-pay

## 2023-11-09 ENCOUNTER — Other Ambulatory Visit: Payer: Self-pay

## 2023-11-09 ENCOUNTER — Other Ambulatory Visit (HOSPITAL_COMMUNITY): Payer: Self-pay

## 2023-11-09 ENCOUNTER — Other Ambulatory Visit (HOSPITAL_BASED_OUTPATIENT_CLINIC_OR_DEPARTMENT_OTHER): Payer: Self-pay

## 2023-11-10 ENCOUNTER — Encounter (HOSPITAL_COMMUNITY): Payer: Self-pay

## 2023-11-10 ENCOUNTER — Other Ambulatory Visit (HOSPITAL_COMMUNITY): Payer: Self-pay

## 2023-11-11 ENCOUNTER — Other Ambulatory Visit (HOSPITAL_COMMUNITY): Payer: Self-pay

## 2024-05-03 ENCOUNTER — Other Ambulatory Visit: Payer: Self-pay | Admitting: Orthopedic Surgery

## 2024-06-06 ENCOUNTER — Other Ambulatory Visit: Payer: Self-pay

## 2024-06-06 ENCOUNTER — Encounter (HOSPITAL_BASED_OUTPATIENT_CLINIC_OR_DEPARTMENT_OTHER): Payer: Self-pay | Admitting: Orthopedic Surgery

## 2024-06-07 ENCOUNTER — Encounter (HOSPITAL_BASED_OUTPATIENT_CLINIC_OR_DEPARTMENT_OTHER)
Admission: RE | Admit: 2024-06-07 | Discharge: 2024-06-07 | Disposition: A | Discharge status: Home / Self Care | Source: Ambulatory Visit | Attending: Orthopedic Surgery | Admitting: Orthopedic Surgery

## 2024-06-07 DIAGNOSIS — Z01810 Encounter for preprocedural cardiovascular examination: Secondary | ICD-10-CM | POA: Insufficient documentation

## 2024-06-07 NOTE — Progress Notes (Signed)

## 2024-06-13 ENCOUNTER — Encounter (HOSPITAL_BASED_OUTPATIENT_CLINIC_OR_DEPARTMENT_OTHER): Payer: Self-pay | Admitting: Certified Registered"

## 2024-06-13 DIAGNOSIS — Z01818 Encounter for other preprocedural examination: Secondary | ICD-10-CM

## 2024-06-13 MED ORDER — BUPIVACAINE HCL (PF) 0.25 % IJ SOLN
INTRAMUSCULAR | Status: AC
  Filled 2024-06-13: qty 90

## 2024-07-18 ENCOUNTER — Ambulatory Visit (HOSPITAL_BASED_OUTPATIENT_CLINIC_OR_DEPARTMENT_OTHER): Admission: RE | Admit: 2024-07-18 | Source: Home / Self Care | Admitting: Orthopedic Surgery

## 2024-07-18 ENCOUNTER — Encounter (HOSPITAL_BASED_OUTPATIENT_CLINIC_OR_DEPARTMENT_OTHER): Admission: RE | Payer: Self-pay | Source: Home / Self Care

## 2024-07-18 DIAGNOSIS — Z01818 Encounter for other preprocedural examination: Secondary | ICD-10-CM

## 2024-07-18 HISTORY — DX: Essential (primary) hypertension: I10

## 2024-07-18 SURGERY — REMOVAL, FOREIGN BODY
Anesthesia: Choice | Site: Hand | Laterality: Right

## 2024-08-01 ENCOUNTER — Other Ambulatory Visit: Payer: Self-pay | Admitting: Family Medicine

## 2024-08-01 DIAGNOSIS — Z1231 Encounter for screening mammogram for malignant neoplasm of breast: Secondary | ICD-10-CM

## 2024-09-03 ENCOUNTER — Other Ambulatory Visit: Payer: Self-pay | Admitting: Medical Genetics

## 2024-09-09 ENCOUNTER — Ambulatory Visit
Admission: RE | Admit: 2024-09-09 | Discharge: 2024-09-09 | Disposition: A | Source: Ambulatory Visit | Attending: Family Medicine | Admitting: Family Medicine

## 2024-09-09 ENCOUNTER — Other Ambulatory Visit: Payer: Self-pay | Admitting: Family Medicine

## 2024-09-09 DIAGNOSIS — Z1231 Encounter for screening mammogram for malignant neoplasm of breast: Secondary | ICD-10-CM

## 2024-10-13 ENCOUNTER — Other Ambulatory Visit

## 2024-10-18 ENCOUNTER — Other Ambulatory Visit: Payer: Self-pay | Admitting: Medical Genetics

## 2024-10-18 DIAGNOSIS — Z006 Encounter for examination for normal comparison and control in clinical research program: Secondary | ICD-10-CM
